# Patient Record
Sex: Female | Born: 1985 | Race: Black or African American | Hispanic: No | Smoking: Never smoker
Health system: Southern US, Community
[De-identification: ages and names within clinical notes are randomized; demographics above are authoritative.]

## PROBLEM LIST (undated history)

## (undated) ENCOUNTER — Inpatient Hospital Stay (HOSPITAL_COMMUNITY): Payer: Self-pay

## (undated) DIAGNOSIS — N920 Excessive and frequent menstruation with regular cycle: Secondary | ICD-10-CM

## (undated) DIAGNOSIS — G43909 Migraine, unspecified, not intractable, without status migrainosus: Secondary | ICD-10-CM

## (undated) DIAGNOSIS — D649 Anemia, unspecified: Secondary | ICD-10-CM

## (undated) HISTORY — PX: NO PAST SURGERIES: SHX2092

## (undated) HISTORY — DX: Migraine, unspecified, not intractable, without status migrainosus: G43.909

---
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
## 2007-01-30 ENCOUNTER — Ambulatory Visit (HOSPITAL_COMMUNITY): Admission: RE | Admit: 2007-01-30 | Discharge: 2007-01-30 | Payer: Self-pay | Admitting: Family Medicine

## 2007-02-26 ENCOUNTER — Ambulatory Visit (HOSPITAL_COMMUNITY): Admission: RE | Admit: 2007-02-26 | Discharge: 2007-02-26 | Payer: Self-pay | Admitting: Obstetrics and Gynecology

## 2007-03-18 ENCOUNTER — Inpatient Hospital Stay (HOSPITAL_COMMUNITY): Admission: AD | Admit: 2007-03-18 | Discharge: 2007-03-18 | Payer: Self-pay | Admitting: Gynecology

## 2007-03-18 ENCOUNTER — Ambulatory Visit: Payer: Self-pay | Admitting: Physician Assistant

## 2007-04-30 ENCOUNTER — Encounter: Payer: Self-pay | Admitting: Obstetrics & Gynecology

## 2007-04-30 ENCOUNTER — Inpatient Hospital Stay (HOSPITAL_COMMUNITY): Admission: AD | Admit: 2007-04-30 | Discharge: 2007-05-03 | Payer: Self-pay | Admitting: Obstetrics & Gynecology

## 2007-04-30 ENCOUNTER — Ambulatory Visit: Payer: Self-pay | Admitting: Obstetrics & Gynecology

## 2007-10-15 ENCOUNTER — Emergency Department (HOSPITAL_COMMUNITY): Admission: EM | Admit: 2007-10-15 | Discharge: 2007-10-15 | Payer: Self-pay | Admitting: Emergency Medicine

## 2008-04-09 ENCOUNTER — Emergency Department (HOSPITAL_COMMUNITY): Admission: EM | Admit: 2008-04-09 | Discharge: 2008-04-09 | Payer: Self-pay | Admitting: Family Medicine

## 2010-02-04 ENCOUNTER — Ambulatory Visit: Payer: Self-pay | Admitting: Nurse Practitioner

## 2010-02-04 DIAGNOSIS — E669 Obesity, unspecified: Secondary | ICD-10-CM

## 2010-02-04 DIAGNOSIS — R635 Abnormal weight gain: Secondary | ICD-10-CM | POA: Insufficient documentation

## 2010-02-04 DIAGNOSIS — R5383 Other fatigue: Secondary | ICD-10-CM

## 2010-02-04 DIAGNOSIS — R5381 Other malaise: Secondary | ICD-10-CM

## 2010-02-08 ENCOUNTER — Encounter (INDEPENDENT_AMBULATORY_CARE_PROVIDER_SITE_OTHER): Payer: Self-pay | Admitting: Nurse Practitioner

## 2010-03-04 ENCOUNTER — Encounter (INDEPENDENT_AMBULATORY_CARE_PROVIDER_SITE_OTHER): Payer: Self-pay | Admitting: Nurse Practitioner

## 2010-03-09 ENCOUNTER — Other Ambulatory Visit: Admission: RE | Admit: 2010-03-09 | Discharge: 2010-03-09 | Payer: Self-pay | Admitting: Internal Medicine

## 2010-03-09 ENCOUNTER — Ambulatory Visit: Payer: Self-pay | Admitting: Nurse Practitioner

## 2010-03-10 ENCOUNTER — Encounter (INDEPENDENT_AMBULATORY_CARE_PROVIDER_SITE_OTHER): Payer: Self-pay | Admitting: Nurse Practitioner

## 2010-03-10 LAB — CONVERTED CEMR LAB
HDL: 55 mg/dL (ref >39)
LDL Cholesterol: 130 mg/dL — ABNORMAL HIGH (ref 0–99)
Total CHOL/HDL Ratio: 3.6
Triglycerides: 68 mg/dL (ref <150)
VLDL: 14 mg/dL (ref 0–40)

## 2010-03-14 ENCOUNTER — Encounter (INDEPENDENT_AMBULATORY_CARE_PROVIDER_SITE_OTHER): Payer: Self-pay | Admitting: Nurse Practitioner

## 2010-03-23 ENCOUNTER — Ambulatory Visit: Payer: Self-pay | Admitting: Nurse Practitioner

## 2010-03-24 ENCOUNTER — Ambulatory Visit: Payer: Self-pay | Admitting: Nurse Practitioner

## 2010-03-24 LAB — CONVERTED CEMR LAB: Preg, Serum: NEGATIVE

## 2010-04-20 ENCOUNTER — Ambulatory Visit: Payer: Self-pay | Admitting: Nurse Practitioner

## 2010-04-20 LAB — CONVERTED CEMR LAB
Blood in Urine, dipstick: NEGATIVE
Cholesterol, target level: 200 mg/dL
Glucose, Urine, Semiquant: NEGATIVE
HDL goal, serum: 40 mg/dL
LDL Goal: 160 mg/dL
Protein, U semiquant: NEGATIVE

## 2010-04-23 ENCOUNTER — Emergency Department (HOSPITAL_COMMUNITY): Admission: EM | Admit: 2010-04-23 | Discharge: 2010-04-23 | Payer: Self-pay | Admitting: Family Medicine

## 2010-04-25 ENCOUNTER — Ambulatory Visit: Payer: Self-pay | Admitting: Nurse Practitioner

## 2010-10-02 LAB — CONVERTED CEMR LAB
Basophils Absolute: 0 10*3/uL (ref 0.0–0.1)
Chlamydia, DNA Probe: NEGATIVE
GC Probe Amp, Genital: NEGATIVE
Glucose, Urine, Semiquant: NEGATIVE
HCT: 36 % (ref 36.0–46.0)
Hemoglobin: 10.6 g/dL — ABNORMAL LOW (ref 12.0–15.0)
Ketones, urine, test strip: NEGATIVE
Lymphocytes Relative: 25 % (ref 12–46)
Monocytes Absolute: 0.6 10*3/uL (ref 0.1–1.0)
Monocytes Relative: 6 % (ref 3–12)
Neutro Abs: 7.3 10*3/uL (ref 1.7–7.7)
Nitrite: POSITIVE
RBC: 4.47 M/uL (ref 3.87–5.11)
Retic Ct Pct: 0.7 % (ref 0.4–3.1)
Specific Gravity, Urine: >=1.03
pH: 5

## 2010-10-04 NOTE — Progress Notes (Signed)
Summary: Office Visit//DEPRESSION SCREENING  Office Visit//DEPRESSION SCREENING   Imported By: Arta Bruce 03/10/2010 09:36:53  _____________________________________________________________________  External Attachment:    Type:   Image     Comment:   External Document

## 2010-10-04 NOTE — Letter (Signed)
Summary: *HSN Results Follow up  [facility]  [address] [place], [place] [address]   Phone: [phone]  Fax: [phone]      03/14/2010   Jean Glass [address] [place], [place]  [address]   Dear  Ms. [name],                            ____S.Drinkard,FNP   ____D. Gore,FNP       ____B. McPherson,MD   ____V. Rankins,MD    ____E. Mulberry,MD    _X___[name], FNP  ____D. Reche Dixon, MD    ____K. Philipp Deputy, MD    ____Other     This letter is to inform you that your recent test(s):  ___X____Pap Smear    _______Lab Test     _______X-ray    ___X____ is within acceptable limits  _______ requires a medication change  _______ requires a follow-up lab visit  _______ requires a follow-up visit with your provider   Comments: Pap Smear results are normal.       _________________________________________________________ If you have any questions, please contact our office [phone].                    Sincerely,    Lehman Prom FNP [facility]

## 2010-10-04 NOTE — Letter (Signed)
Summary: PT INFORMATION SHEET  PT INFORMATION SHEET   Imported By: Arta Bruce 03/14/2010 15:37:35  _____________________________________________________________________  External Attachment:    Type:   Image     Comment:   External Document

## 2010-10-04 NOTE — Letter (Signed)
Summary: RECEIVED RECORDS FROM [facility]  RECEIVED RECORDS FROM [facility]   Imported By: Arta Bruce 05/25/2010 14:53:06  _____________________________________________________________________  External Attachment:    Type:   Image     Comment:   External Document

## 2010-10-04 NOTE — Letter (Signed)
Summary: REQUESTING RECORDS FROM [facility]  REQUESTING RECORDS FROM [facility]   Imported By: Arta Bruce 02/25/2010 12:48:31  _____________________________________________________________________  External Attachment:    Type:   Image     Comment:   External Document

## 2010-10-04 NOTE — Assessment & Plan Note (Signed)
Summary: Acute - Candidiasis   Vital Signs:  Patient profile:   25 year old female Menstrual status:  regular Height:      63.5 inches Weight:      184 pounds Temp:     97.9 degrees F oral Pulse rate:   87 / minute Pulse rhythm:   regular Resp:     18 per minute BP sitting:   101 / 66  (left arm)  Vitals Entered By: [place] Shannon (April 20, 2010 9:38 AM) CC: uti, Vaginal discharge, Lipid Management Is Patient Diabetic? No Pain Assessment Patient in pain? no       Does patient need assistance? Functional Status Self care Ambulation Normal   CC:  uti, Vaginal discharge, and Lipid Management.  History of Present Illness:  Pt into the office with complaints of vaginal irritation   Vaginal discharge      This is a 25 year old woman who presents with Vaginal discharge.  The patient complains of itching and vaginal burning, but denies burning on urination, frequency, urgency, and fever.  The discharge is described as malodorous.  Risk factors for vaginal discharge include recent antibiotics - macrobid for cystitis. Pt reports that she started 1 week after the Rx was sent to the pharmacy but she completed the entire course.   Lipid Management History:      Negative NCEP/ATP III risk factors include female age less than 34 years old and non-tobacco-user status.    Current Medications (verified): 1)  Depo-Provera 150 Mg/ml Susp (Medroxyprogesterone Acetate) .... One Injection Every 3 Months 2)  Macrobid 100 Mg Caps (Nitrofurantoin Monohyd Macro) .... One Capsule By Mouth Two Times A Day For Infection  Allergies (verified): No Known Drug Allergies  Review of Systems General:  Denies fever. GI:  Denies abdominal pain, nausea, and vomiting. GU:  Denies discharge; +vaginal irritation.  Physical Exam  General:  alert.   Head:  normocephalic.   Lungs:  normal breath sounds.   Heart:  normal rate and regular rhythm.   Abdomen:  normal bowel sounds.   Genitalia:  self wet  prep   Impression & Recommendations:  Problem # 1:  CANDIDIASIS (ICD-112.9) most likely due to recent antibiotics Orders: KOH/ WET Mount [phone])  Problem # 2:  ACUTE CYSTITIS (ICD-595.0) will resend urine for culture to test for cure The following medications were removed from the medication list:    Macrobid 100 Mg Caps (Nitrofurantoin monohyd macro) ..... One capsule by mouth two times a day for infection  Orders: UA Dipstick w/o Micro (manual) (82956) T-Culture, Urine ([id number])  Complete Medication List: 1)  Depo-provera 150 Mg/ml Susp (Medroxyprogesterone acetate) .... One injection every 3 months 2)  Terazol 3 0.8 % Crea (Terconazole) .... One application intravaginally for 3 nights  Lipid Assessment/Plan:      Based on NCEP/ATP III, the patient's risk factor category is "0-1 risk factors".  The patient's lipid goals are as follows: Total cholesterol goal is 200; LDL cholesterol goal is 160; HDL cholesterol goal is 40; Triglyceride goal is 150.     Patient Instructions: 1)  Urine will be sent for culture to be sure that infection has cleared. 2)  You do have a yeast infection which is likely due to recently taking antibiotics. Use vaginal cream as ordered for 3 nights 3)  Follow up as needed Prescriptions: TERAZOL 3 0.8 % CREA (TERCONAZOLE) One application intravaginally for 3 nights  #20gm x 0   Entered and Authorized by:  Lehman Prom FNP   Signed by:   Lehman Prom FNP on 04/20/2010   Method used:   Print then Give to Patient   RxID:   [id number]   Laboratory Results   Urine Tests  Date/Time Received: April 20, 2010 .  Routine Urinalysis   Glucose: negative   (Normal Range: Negative) Bilirubin: negative   (Normal Range: Negative) Ketone: negative   (Normal Range: Negative) Spec. Gravity: >=1.030   (Normal Range: 1.003-1.035) Blood: negative   (Normal Range: Negative) pH: 7.5   (Normal Range: 5.0-8.0) Protein: negative   (Normal Range:  Negative) Urobilinogen: 0.2   (Normal Range: 0-1) Nitrite: negative   (Normal Range: Negative) Leukocyte Esterace: moderate   (Normal Range: Negative)    Date/Time Received: April 20, 2010 10:17 AM   [employer] Mount Source: vaginal WBC/hpf: 1-5 Bacteria/hpf: rare Clue cells/hpf: few Yeast/hpf: moderate Wet Mount KOH: Negative Trichomonas/hpf: none

## 2010-10-04 NOTE — Assessment & Plan Note (Signed)
Summary: Abnormal menstrual cycle   Vital Signs:  Patient profile:   25 year old female Menstrual status:  regular LMP:     03/2010 Weight:      185.8 pounds Temp:     98.1 degrees F oral Pulse rate:   75 / minute Pulse rhythm:   regular Resp:     20 per minute BP sitting:   103 / 70  (left arm) Cuff size:   regular  Vitals Entered By: Levon Hedger (April 25, 2010 3:12 PM) CC: pt has been experiencing a heavy blood flow since Saturday... Is Patient Diabetic? No Pain Assessment Patient in pain? no       Does patient need assistance? Functional Status Self care Ambulation Normal LMP (date): 03/2010 LMP - Character: normal Menarche (age onset years): 16    Menstrual flow (days): 6 Enter LMP: 03/2010 Last PAP Result  Specimen Adequacy: Satisfactory for evaluation.   Interpretation/Result:Negative for intraepithelial Lesion or Malignancy.      CC:  pt has been experiencing a heavy blood flow since Saturday....  History of Present Illness:  Pt into the office with complaints of bleeding. She was started on Depoprovera on 03/09/2010.  She had previously been on depoprovera several years ago at age 34 during which time she did not have a menses while on the medication.  Of concern now is that pt has restarted her depoprovera and now she has a menses.  She is unsure if this is normal. She was treated on last week for candidiasis and took the medication for 3 nights as ordered.  On the following day the bleeding started. -abdominal cramps -no increase in flow other than what she usually has during menses -nausea or vomiting  -diarrhea -fever  Allergies (verified): No Known Drug Allergies  Review of Systems General:  Denies fever. CV:  Denies chest pain or discomfort. Resp:  Denies cough. GI:  Denies nausea and vomiting. GU:  Complains of abnormal vaginal bleeding; ? menses that started .  Physical Exam  General:  alert.   Head:  normocephalic.   Msk:  normal  ROM.   Neurologic:  alert & oriented X3.   Skin:  color normal.   Psych:  Oriented X3.     Impression & Recommendations:  Problem # 1:  CANDIDIASIS (ICD-112.9) pt has finished treatment as ordered  Problem # 2:  FAMILY PLANNING (ICD-V25.09) pt is on depo provera. most likely bleeding is a side effect from restarting medications advised pt that irregular bleeding will most likely improve after being on medication for 6-9 months. she is to keep appt for next injection  Complete Medication List: 1)  Depo-provera 150 Mg/ml Susp (Medroxyprogesterone acetate) .... One injection every 3 months  Patient Instructions: 1)  Keep your appointment for depoprovera at next scheduled visit. 2)  Be mindful that current discharge is likely from re-starting the depoprovera.  This should get better after you take the next injection.

## 2010-10-04 NOTE — Assessment & Plan Note (Signed)
Summary: DEPO INJECTION / NS  Nurse Visit   Allergies: No Known Drug Allergies  Medication Administration  Injection # 1:    Medication: Depo-Provera 150mg     Diagnosis: FAMILY PLANNING (ICD-V25.09)    Route: IM    Site: RUOQ gluteus    Exp Date: 07/2012    Lot #: [id number]    Mfr: greenstone    Comments: next injection 06/09/2010    Patient tolerated injection without complications    Given by: Levon Hedger (March 24, 2010 1:06 PM)  Orders Added: 1)  Admin of Therapeutic Inj  intramuscular or subcutaneous [96372] 2)  Depo-Provera 150mg  [J1055] 3)  Est. Patient Level I [67209]     Medication Administration  Injection # 1:    Medication: Depo-Provera 150mg     Diagnosis: FAMILY PLANNING (ICD-V25.09)    Route: IM    Site: RUOQ gluteus    Exp Date: 07/2012    Lot #: [id number]    Mfr: greenstone    Comments: next injection 06/09/2010    Patient tolerated injection without complications    Given by: Levon Hedger (March 24, 2010 1:06 PM)  Orders Added: 1)  Admin of Therapeutic Inj  intramuscular or subcutaneous [96372] 2)  Depo-Provera 150mg  [J1055] 3)  Est. Patient Level I [28366]

## 2010-10-04 NOTE — Assessment & Plan Note (Signed)
Summary: Complete Physical Exam   Vital Signs:  Patient profile:   25 year old female Menstrual status:  regular LMP:     03/01/2010 Weight:      181.5 pounds BMI:     31.76 BSA:     1.87 Temp:     98.3 degrees F oral Pulse rate:   79 / minute Pulse rhythm:   regular Resp:     20 per minute BP sitting:   119 / 80  (left arm) Cuff size:   regular  Vitals Entered By: Levon Hedger (March 09, 2010 9:14 AM)  Nutrition Counseling: Patient's BMI is greater than 25 and therefore counseled on weight management options. CC: CPP Is Patient Diabetic? No Pain Assessment Patient in pain? no       Does patient need assistance? Functional Status Self care Ambulation Normal LMP (date): 03/01/2010 LMP - Character: normal Menarche (age onset years): 16    Menstrual flow (days): 6 Menstrual Status regular Enter LMP: 03/01/2010   CC:  CPP.  History of Present Illness:  Pt into the office for a complete physical exam  PAP - last PAP 1 year. Abnormal PAP in 2007 for which she had repeat in 6 months and it was normal. No intervention. 1 child age 15 months Menses - monthly.  ? birth control - previously taking depoprovera before the birth of her child in both 2007 and 2009. Pt is sexually active at this time.  Mammogram - No history of mammogram  no family history of breast cancer self breast exams at home  Optho - no glasses or contacts.  never had an eye exam. no current problems with eyes  Dental - no recent dental exam.  tdap - last over 10 years ago.  pt is fasting today for labs  Habits & Providers  Alcohol-Tobacco-Diet     Alcohol drinks/day: 0     Tobacco Status: never  Exercise-Depression-Behavior     Does Patient Exercise: no     Exercise Counseling: to improve exercise regimen     Drug Use: never  Comments: PHQ-9 score = 12  Medications Prior to Update: 1)  None  Allergies (verified): No Known Drug Allergies  Social History: Does Patient  Exercise:  no  Review of Systems General:  Complains of fatigue; denies fever. Eyes:  Denies blurring. ENT:  Denies earache. CV:  Denies chest pain or discomfort. Resp:  Denies cough. GI:  Denies abdominal pain, nausea, and vomiting. GU:  Denies discharge. MS:  Denies joint pain. Derm:  Denies rash. Neuro:  Denies headaches. Psych:  Denies anxiety and depression. Endo:  Denies excessive urination.  Physical Exam  General:  alert.   Head:  normocephalic.   Eyes:  vision grossly intact, pupils equal, and pupils round.   Ears:  bil TM with bony landmarks present minimal cerumen bilaterally Nose:  no nasal discharge.   Mouth:  pharynx pink and moist and fair dentition.   right upper molar broken  Neck:  supple.   Chest Wall:  no mass.   Breasts:  no masses.   Lungs:  normal breath sounds.   Heart:  normal rate and regular rhythm.   Abdomen:  soft, non-tender, and normal bowel sounds.   Msk:  up to the exam table Pulses:  R radial normal and R dorsalis pedis normal.   Extremities:  no edema Neurologic:  alert & oriented X3, cranial nerves II-XII intact, and gait normal.   Skin:  multiple tattoos on  back, arms and legs Psych:  Oriented X3.    Pelvic Exam  Vulva:      normal appearance.   Urethra and Bladder:      Urethra--no masses.   Vagina:      physiologic discharge.   Cervix:      midposition.   Uterus:      smooth.   Adnexa:      nontender bilaterally.      Impression & Recommendations:  Problem # 1:  ROUTINE GYNECOLOGICAL EXAMINATION (ICD-V72.31)  PHQ-9 score = 12 PAP done labs done  rec. dental exam no tdap available - advised pt that she needs to get at next visit Orders: KOH/ WET Mount [id number]) Pap Smear, Thin Prep ( Collection of) [phone]) T- GC Chlamydia (09811) T-HIV Antibody  (Reflex) ([id number]) T-Syphilis Test ([facility]) ([id number])  Problem # 2:  FAMILY PLANNING (ICD-V25.09) pt would like to restart on the depoprovera Rx written today -  pt to return today for injection (addendum: pt did not return today for injection) LMP ended yesterday  Complete Medication List: 1)  Depo-provera 150 Mg/ml Susp (Medroxyprogesterone acetate) .... One injection every 3 months  Other Orders: UA Dipstick w/o Micro (manual) (96295) T-Lipid Profile ([id number]) T-Comprehensive Metabolic Panel ([id number]) T- * Misc. Laboratory test [phone]) T-TSH ([phone]) T-Culture, Urine [phone])  Patient Instructions: 1)  Labs will be done today and you will be notified of the results. 2)  You can restart depoprovera - go get the medication and bring back to this office today for injection.  [facility] front desk staff to inform nurse that you have returned. 3)  Schedule an appointment for a dental exam. You have medicaid so find a dentist that will accept medicaid 4)  Will need tdap at next visit 5)  Follow up as needed or yearly for physical exams Prescriptions: DEPO-PROVERA 150 MG/ML SUSP (MEDROXYPROGESTERONE ACETATE) One injection every 3 months  #1 x 4   Entered and Authorized by:   Lehman Prom FNP   Signed by:   Lehman Prom FNP on 03/09/2010   Method used:   Print then Give to Patient   RxID:   [id number]   Laboratory Results   Urine Tests  Date/Time Received: March 09, 2010 9:42 AM   Routine Urinalysis   Color: yellow Glucose: negative   (Normal Range: Negative) Bilirubin: small   (Normal Range: Negative) Ketone: negative   (Normal Range: Negative) Spec. Gravity: >=1.030   (Normal Range: 1.003-1.035) Blood: trace-intact   (Normal Range: Negative) pH: 5.0   (Normal Range: 5.0-8.0) Protein: trace   (Normal Range: Negative) Urobilinogen: 0.2   (Normal Range: 0-1) Nitrite: positive   (Normal Range: Negative) Leukocyte Esterace: trace   (Normal Range: Negative)    Date/Time Received: March 09, 2010 10:10 AM   Wet Mount Source: vaginal WBC/hpf: 1-5 Bacteria/hpf: 1+ Clue cells/hpf: none Yeast/hpf: none Wet  Mount KOH: Negative Trichomonas/hpf: none    Prevention & Chronic Care Immunizations   Influenza vaccine: Not documented    Tetanus booster: Not documented   Td booster deferral: Not available  (03/09/2010)    Pneumococcal vaccine: Not documented  Other Screening   Pap smear: Not documented   Pap smear action/deferral: Ordered  (03/09/2010)   Pap smear due: 03/10/2011   Smoking status: never  (03/09/2010)

## 2010-10-04 NOTE — Letter (Signed)
Summary: Handout Printed  Printed Handout:  - Birth Control Choices 

## 2010-10-04 NOTE — Assessment & Plan Note (Signed)
Summary: NEW - Establish Care   Vital Signs:  Patient profile:   25 year old female LMP:     01/2010 Height:      63.5 inches Weight:      182.8 pounds BMI:     31.99 BSA:     1.87 Temp:     98.1 degrees F oral Pulse rate:   94 / minute Pulse rhythm:   regular Resp:     20 per minute BP sitting:   106 / 74  (left arm) Cuff size:   regular  Vitals Entered By: Levon Hedger (February 04, 2010 10:42 AM) CC: new establish...needs physical Is Patient Diabetic? No Pain Assessment Patient in pain? no       Does patient need assistance? Functional Status Self care Ambulation Normal LMP (date): 01/2010     Enter LMP: 01/2010   CC:  new establish...needs physical.  History of Present Illness:  Pt into the office to establish care. Pt was previously seen at [facility] for yearly PAP smears - last done ove 1 year ago. 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  She was then transitioned to depoprovera.  Last injection was in August 2010 and at that time her medicaid ended so she was not able to continue to get the injections. Main side effects was weight gain from the depoprovera She has never tried oral contraception or patches  No acute problems today  Habits & Providers  Alcohol-Tobacco-Diet     Alcohol drinks/day: 0     Tobacco Status: never  Exercise-Depression-Behavior     Drug Use: never  Medications Prior to Update: 1)  None  Allergies (verified): No Known Drug Allergies  Past History:  Past Medical History: none  Past Surgical History: Caesarean section  Family History: maternal grandmother - htn  Social History: 1 child tobacco - none ETOH - none Drug - noneSmoking Status:  never Drug Use:  never  Review of  Systems General:  Complains of fatigue; denies loss of appetite; recent weight gain. CV:  Denies chest pain or discomfort. Resp:  Denies cough and shortness of breath. GI:  Denies constipation, nausea, and vomiting. MS:  Denies joint pain.  Physical Exam  General:  alert.   Head:  normocephalic.   Lungs:  normal breath sounds.   Heart:  normal rate and regular rhythm.   Msk:  up to the exam table Neurologic:  alert & oriented X3, cranial nerves II-XII intact, and gait normal.   Skin:  color normal.   Psych:  Oriented X3.     Impression & Recommendations:  Problem # 1:  WEIGHT GAIN (ICD-783.1) ? if due to depoprovera will advise pt to increase her physical activity  Problem # 2:  FATIGUE (ICD-780.79) will check labs on next visit  Problem # 3:  FAMILY PLANNING (ICD-V25.09) handout given on available options pt to review and will discuss at next visit.  Patient Instructions: 1)  You will need to schedule an appointment for a complete physical exam.  Do not eat after midnight before this visit. 2)  Sign a release to get records from [facility]. 3)  Read handout on birth control  Review to determine  which option may be the best for you.

## 2010-11-17 LAB — POCT URINALYSIS DIPSTICK
Bilirubin Urine: NEGATIVE
Glucose, UA: NEGATIVE mg/dL
Nitrite: NEGATIVE
Protein, ur: NEGATIVE mg/dL
Specific Gravity, Urine: 1.02 (ref 1.005–1.030)
Urobilinogen, UA: 1 mg/dL (ref 0.0–1.0)
pH: 7 (ref 5.0–8.0)

## 2010-11-17 LAB — POCT PREGNANCY, URINE: Preg Test, Ur: NEGATIVE

## 2010-11-17 LAB — WET PREP, GENITAL: Yeast Wet Prep HPF POC: NONE SEEN

## 2010-11-17 LAB — GC/CHLAMYDIA PROBE AMP, GENITAL
Chlamydia, DNA Probe: NEGATIVE
GC Probe Amp, Genital: NEGATIVE

## 2011-01-17 NOTE — Op Note (Signed)
[name], [name]           ACCOUNT NO.:  [id number]   MEDICAL RECORD NO.:  [id number]          PATIENT TYPE:  INP   LOCATION:  9119                          FACILITY:  [facility]   PHYSICIAN:  Johnella Moloney, MD        DATE OF BIRTH:  [birth year]   DATE OF PROCEDURE:  04/30/2007  DATE OF DISCHARGE:                               OPERATIVE REPORT   PREOPERATIVE DIAGNOSIS:  Non-reassuring fetal heart tracing.   POSTOPERATIVE DIAGNOSIS:  Non-reassuring fetal heart tracing.   PROCEDURE:  Primary low transverse cesarean section via Pfannenstiel  incision.   SURGEON:  Dr. Elsie Lincoln, Dr. Johnella Moloney.   ASSISTANT:  Dr. Darrol Angel.   ANESTHESIA:  Epidural.   IV FLUIDS:  1800 mL   ESTIMATED BLOOD LOSS:  700 mL   SPECIMENS:  Placenta to pathology.   COMPLICATIONS:  None.   FINDINGS:  Viable female infant in cephalic presentation.  Apgars were 8  and 9.  Weight 6 pounds, 7 ounces.  Cord pH was 7.32. Spontaneous  placental delivery, intact with three-vessel cord.  Normal uterus,  ovaries and tubes bilaterally.   PROCEDURE IN DETAIL:  The patient was taken to the operating room for  urgent cesarean section given second episode of prolonged bradycardia.  She was urgently prepped and draped in a sterile manner.  The patient  was then placed in a dorsal supine position.  A Pfannenstiel skin  incision was then made and taken to the layer of underlying fascia.  The  fascia was incised in the midline, and this incision was extended  bilaterally using a Mayo scissors.  Kochers were used to elevate the  superior aspect of this incision and the rectus muscles were dissected  off bluntly and sharply.  A similar process was carried out inferiorly.  The rectus muscles were separated in the midline and the peritoneum was  entered bluntly.  The peritoneal incision was extended bluntly with good  visualization of the bowel and bladder.  The lower uterine segment was  then recognized.  A bladder flap was  created and a low transverse  hysterotomy was also created using a scalpel and blunt methods.  The  infant was then delivered atraumatically.  The cord was clamped and cut  and the infant was handed over to the waiting pediatricians.  Cord blood  was collected.  The placenta was then delivered spontaneously and the  uterus was cleared of all clot and debris.  The uterus was exteriorized  and the hysterotomy was closed with 0 Vicryl in a running locked  fashion.  A second layer was used for hemostasis.  Good hemostasis was  noted overall and excellent tone was noted.  The uterus was then  returned to the abdomen.  The pelvis  and abdomen were then copiously  irrigated with normal saline, and good hemostasis was  noted overall.  At this point the fascia was closed with 0 Vicryl in a  running fashion, and the skin was closed with staples.  The patient  tolerated the procedure well.  Sponge, lap, needle and instruments  counts were correct times two.  The patient was taken to the recovery  room in stable condition.      Johnella Moloney, MD  Electronically Signed     UD/MEDQ  D:  04/30/2007  T:  05/01/2007  Job:  [id number]

## 2011-01-17 NOTE — Discharge Summary (Signed)
Jean Glass, Jean Glass           ACCOUNT NO.:  [id number]   MEDICAL RECORD NO.:  [id number]          PATIENT TYPE:  INP   LOCATION:  9119                          FACILITY:  [facility]   PHYSICIAN:  Allie Bossier, MD        DATE OF BIRTH:  May 10, 1986   DATE OF ADMISSION:  04/30/2007  DATE OF DISCHARGE:  05/03/2007                               DISCHARGE SUMMARY   ADMISSION DIAGNOSIS:  Term pregnancy at 40 weeks' gestation. Admitted  for induction of labor secondary to variable decelerations noted on  fetal heart tracing.   DISCHARGE DIAGNOSIS:  Postoperative day 3, status post low transverse C  section for fetal bradycardia, nonreassuring fetal heart tracing.   HOSPITAL COURSE:  The patient was admitted on 04/30/07, with complaint of  contractions. Placed on the monitor and found not to be in active labor.  However, on fetal heart tracing the patient's  fetus was noted to have  several variable decelerations. Given that the patient was at term it  was decided that she should be admitted for induction of labor. The  patient tolerated induction of labor well. However, at bout 2:00 p.m. on  04/30/07, on fetal heart tracing the fetus was noted to have  nonreassuring fetal heart tracing and bradycardia.   Therefore, was taken for primary low transverse C section. This  operation was performed by Drs. Penne Lash and Duru with assistant Dr. Kipp Laurence. Estimated blood loss in this procedure was 700 mL and was  without complications. The operation rendered a viable female infant  without Apgars of 8 and 9 and a weight of 6 pounds and 7 ounces.  Placenta was delivered without difficulty and the uterus was cleared of  clot and debris. Cord pH was performed and found to be 7.32.   Postoperatively the patient recovered well without any complications.  Postoperative CBC was noted to have a hemoglobin of 8.4, hematocrit of  26.2, platelet count of 204 and white blood cell count of 14.4.  Throughout the  rest of her hospital course the patient remained  asymptomatic for anemia and remained afebrile with no worrisome changes  and vital signs throughout the rest of her hospital stay.   On postoperative day 3 the patient was ready for discharge without any  complications. Staples were removed prior to discharge and the patient  was discharged to home in good and stable condition with infant.   DISCHARGE MEDICATIONS:  The patient is discharged home on  1. Motrin 600 mg orally q. 6 h p.r.n. pain.  2. Percocet 5/325 one to two tabs orally 4-6 h p.r.n., pain.  3. Colace 100 mg orally b.i.d. p.r.n., constipation.  4. Ferrous sulfate 325 mg orally b.i.d. for anemia.  5. Prenatal vitamins 1 tab orally once daily.   DISCHARGE INSTRUCTIONS:  1. The patient is to take all medication mentioned previously.  2. The patient is to follow up in 6 weeks at [facility]      [facility].  3. The patient is to maintain pelvic rest for 6 weeks with nothing in      the  vagina over this time.  4. The patient is to not undertake any heavy lifting for 6 weeks.  5. The patient is to return prior to her routine scheduled followup      for any worsening symptoms including      increased pain, increased bleeding, dizziness, presyncopal symptoms      or any fevers, chills or any other worrisome symptoms.   DISCHARGE CONDITION:  Stable, good.      [name], MD      Allie Bossier, MD  Electronically Signed    TE/MEDQ  D:  05/03/2007  T:  05/04/2007  Job:  [id number]

## 2011-01-20 NOTE — Op Note (Signed)
NAME:  Jean Glass, Jean Glass                     ACCOUNT NO.:  [id number]   MEDICAL RECORD NO.:  [id number]                   PATIENT TYPE:  OIB   LOCATION:  2899                                 FACILITY:  [facility]   PHYSICIAN:  Jon Gills, M.D.               DATE OF BIRTH:  29-Apr-1986   DATE OF PROCEDURE:  04/08/2004  DATE OF DISCHARGE:                                 OPERATIVE REPORT   PREOPERATIVE DIAGNOSIS:  Lower gastrointestinal bleeding, undetermined  cause.   POSTOPERATIVE DIAGNOSIS:  Lower gastrointestinal bleeding, undetermined  cause.   PROCEDURE:  Colonoscopy with biopsy.   SURGEON:  Jon Gills, M.D.   DESCRIPTION OF PROCEDURE:  Following written informed consent, the patient  was sedated with Versed and propofol by an anesthesiologist.  Examination of  the perineum revealed no tags or fissures.  Digital examination of the  rectum revealed an empty rectal vault.  The colonoscope was inserted per  rectum and advanced to 110 cm to the ascending colon.  Normal mucosa was  seen throughout although formed stool partially obscured the lumen in the  transverse colon.  No evidence for polyps or vascular abnormalities was  seen.  There was no visible evidence for blood.  Multiple surveillance  biopsies were obtained at three points within the colon.  The colonoscope  was withdrawn, and the patient was awakened and taken to the recovery room  in satisfactory condition.  It was my impression that her lower GI bleeding  was secondary to constipation with occult perianal tearing.  I placed her on  a trial of MiraLax 17 g p.o. daily and scheduled a two month return  appointment.   DESCRIPTION OF SCOPE:  Olympus 160 colonoscope.   DESCRIPTION OF SPECIMENS REMOVED:  Colon biopsies x6 submitted in three  individual jars.                                               Jon Gills, M.D.    JHC/MEDQ  D:  04/08/2004  T:  04/09/2004  Job:  [id number]

## 2011-05-26 LAB — URINALYSIS, ROUTINE W REFLEX MICROSCOPIC
Glucose, UA: NEGATIVE
Specific Gravity, Urine: 1.02
pH: 6.5

## 2011-05-26 LAB — URINE CULTURE: Colony Count: >=100000

## 2011-05-26 LAB — URINE MICROSCOPIC-ADD ON

## 2011-05-26 LAB — POCT PREGNANCY, URINE: Preg Test, Ur: NEGATIVE

## 2011-06-02 LAB — POCT URINALYSIS DIP (DEVICE)
Bilirubin Urine: NEGATIVE
Glucose, UA: NEGATIVE
Nitrite: POSITIVE — AB
Specific Gravity, Urine: 1.02

## 2011-06-02 LAB — GC/CHLAMYDIA PROBE AMP, GENITAL: Chlamydia, DNA Probe: POSITIVE — AB

## 2011-06-16 LAB — CBC
HCT: 34 — ABNORMAL LOW
Hemoglobin: 11 — ABNORMAL LOW
Hemoglobin: 8.4 — ABNORMAL LOW
MCHC: 32
Platelets: 285
RBC: 3.62 — ABNORMAL LOW
WBC: 12.3 — ABNORMAL HIGH

## 2011-06-20 LAB — URINALYSIS, ROUTINE W REFLEX MICROSCOPIC
Glucose, UA: NEGATIVE
Hgb urine dipstick: NEGATIVE
Protein, ur: 30 — AB
pH: 6.5

## 2011-06-20 LAB — WET PREP, GENITAL
Clue Cells Wet Prep HPF POC: NONE SEEN
Trich, Wet Prep: NONE SEEN

## 2011-06-20 LAB — URINE MICROSCOPIC-ADD ON

## 2011-06-20 LAB — GC/CHLAMYDIA PROBE AMP, GENITAL
Chlamydia, DNA Probe: NEGATIVE
GC Probe Amp, Genital: NEGATIVE

## 2011-06-20 LAB — CULTURE, ROUTINE-ABSCESS

## 2012-06-24 ENCOUNTER — Emergency Department (HOSPITAL_COMMUNITY): Payer: Self-pay

## 2012-06-24 ENCOUNTER — Encounter (HOSPITAL_COMMUNITY): Payer: Self-pay | Admitting: Adult Health

## 2012-06-24 ENCOUNTER — Emergency Department (HOSPITAL_COMMUNITY)
Admission: EM | Admit: 2012-06-24 | Discharge: 2012-06-24 | Disposition: A | Discharge status: Home / Self Care | Payer: Self-pay | Attending: Emergency Medicine | Admitting: Emergency Medicine

## 2012-06-24 DIAGNOSIS — Z862 Personal history of diseases of the blood and blood-forming organs and certain disorders involving the immune mechanism: Secondary | ICD-10-CM | POA: Insufficient documentation

## 2012-06-24 DIAGNOSIS — Z3201 Encounter for pregnancy test, result positive: Secondary | ICD-10-CM | POA: Insufficient documentation

## 2012-06-24 DIAGNOSIS — R10814 Left lower quadrant abdominal tenderness: Secondary | ICD-10-CM | POA: Insufficient documentation

## 2012-06-24 DIAGNOSIS — Z3490 Encounter for supervision of normal pregnancy, unspecified, unspecified trimester: Secondary | ICD-10-CM

## 2012-06-24 DIAGNOSIS — N898 Other specified noninflammatory disorders of vagina: Secondary | ICD-10-CM | POA: Insufficient documentation

## 2012-06-24 HISTORY — DX: Anemia, unspecified: D64.9

## 2012-06-24 LAB — BASIC METABOLIC PANEL
BUN: 10 mg/dL (ref 6–23)
Calcium: 9.5 mg/dL (ref 8.4–10.5)
GFR calc non Af Amer: >90 mL/min (ref >90)
Glucose, Bld: 113 mg/dL — ABNORMAL HIGH (ref 70–99)
Sodium: 138 mEq/L (ref 135–145)

## 2012-06-24 LAB — URINALYSIS, MICROSCOPIC ONLY
Glucose, UA: NEGATIVE mg/dL
Leukocytes, UA: NEGATIVE
Specific Gravity, Urine: 1.03 (ref 1.005–1.030)
pH: 6 (ref 5.0–8.0)

## 2012-06-24 LAB — CBC WITH DIFFERENTIAL/PLATELET
Eosinophils Absolute: 0.1 10*3/uL (ref 0.0–0.7)
Eosinophils Relative: 1 % (ref 0–5)
Lymphs Abs: 2.7 10*3/uL (ref 0.7–4.0)
MCH: 22.6 pg — ABNORMAL LOW (ref 26.0–34.0)
MCHC: 31.5 g/dL (ref 30.0–36.0)
MCV: 72 fL — ABNORMAL LOW (ref 78.0–100.0)
Platelets: 302 10*3/uL (ref 150–400)
RBC: 4.46 MIL/uL (ref 3.87–5.11)
RDW: 17.9 % — ABNORMAL HIGH (ref 11.5–15.5)

## 2012-06-24 LAB — WET PREP, GENITAL
Trich, Wet Prep: NONE SEEN
Yeast Wet Prep HPF POC: NONE SEEN

## 2012-06-24 NOTE — ED Notes (Signed)
Pt dc to home.  Pt ambulatory to exit without difficulty.  Pt states understanding to dc paperwork.  Jean Glass,

## 2012-06-24 NOTE — ED Provider Notes (Signed)
History     CSN: [id number]  Arrival date & time 06/24/12  1927   First MD Initiated Contact with Patient 06/24/12 2010      Chief Complaint  Patient presents with  . Abdominal Pain    (Consider location/radiation/quality/duration/timing/severity/associated sxs/prior treatment) HPI Pt reports several days of aching lower abdominal pain, blood in urine and fullness like she is about to begin her menses. She is approx 2 weeks late on her period. She denies any vomiting, diarrhea, or dysuria. No vaginal bleeding or discharge. She is P2G1A0.   Past Medical History  Diagnosis Date  . Anemia     History reviewed. No pertinent past surgical history.  History reviewed. No pertinent family history.  History  Substance Use Topics  . Smoking status: Never Smoker   . Smokeless tobacco: Not on file  . Alcohol Use: No    OB History    Grav Para Term Preterm Abortions TAB SAB Ect Mult Living                  Review of Systems All other systems reviewed and are negative except as noted in HPI.   Allergies  Review of patient's allergies indicates no known allergies.  Home Medications  No current outpatient prescriptions on file.  BP 117/67  Pulse 78  Temp 97.6 F (36.4 C) (Oral)  Resp 16  SpO2 100%  Physical Exam  Nursing note and vitals reviewed. Constitutional: She is oriented to person, place, and time. She appears well-developed and well-nourished.  HENT:  Head: Normocephalic and atraumatic.  Eyes: EOM are normal. Pupils are equal, round, and reactive to light.  Neck: Normal range of motion. Neck supple.  Cardiovascular: Normal rate, normal heart sounds and intact distal pulses.   Pulmonary/Chest: Effort normal and breath sounds normal.  Abdominal: Bowel sounds are normal. She exhibits no distension. There is tenderness (mild lower abdominal tenderness). There is no rebound and no guarding.  Genitourinary: Uterus is not tender. Cervix exhibits discharge. Cervix  exhibits no motion tenderness. Right adnexum displays no mass and no tenderness. Left adnexum displays no mass and no tenderness. No bleeding around the vagina. Vaginal discharge found.  Musculoskeletal: Normal range of motion. She exhibits no edema and no tenderness.  Neurological: She is alert and oriented to person, place, and time. She has normal strength. No cranial nerve deficit or sensory deficit.  Skin: Skin is warm and dry. No rash noted.  Psychiatric: She has a normal mood and affect.    ED Course  Procedures (including critical care time)  Labs Reviewed  URINALYSIS, MICROSCOPIC ONLY - Abnormal; Notable for the following:    APPearance CLOUDY (*)     Nitrite POSITIVE (*)     Bacteria, UA MANY (*)     All other components within normal limits  POCT PREGNANCY, URINE - Abnormal; Notable for the following:    Preg Test, Ur POSITIVE (*)     All other components within normal limits  CBC WITH DIFFERENTIAL - Abnormal; Notable for the following:    WBC 13.0 (*)     Hemoglobin 10.1 (*)     HCT 32.1 (*)     MCV 72.0 (*)     MCH 22.6 (*)     RDW 17.9 (*)     Neutro Abs 9.3 (*)     All other components within normal limits  BASIC METABOLIC PANEL - Abnormal; Notable for the following:    Glucose, Bld 113 (*)  All other components within normal limits  HCG, QUANTITATIVE, PREGNANCY - Abnormal; Notable for the following:    hCG, Beta Chain, Quant, S 14177 (*)     All other components within normal limits  WET PREP, GENITAL - Abnormal; Notable for the following:    Clue Cells Wet Prep HPF POC FEW (*)     All other components within normal limits  ABO/RH  URINE CULTURE  GC/CHLAMYDIA PROBE AMP, GENITAL   US Ob Comp Less 14 Wks  06/24/2012  *RADIOLOGY REPORT*  Clinical Data: Pelvic pain and cramping.  OBSTETRIC <14 WK [place] AND TRANSVAGINAL OB US  Technique:  Both transabdominal and transvaginal ultrasound examinations were performed for complete evaluation of the gestation as well  as the maternal uterus, adnexal regions, and pelvic cul-de-sac.  Transvaginal technique was performed to assess early pregnancy.  Comparison:  None.  Intrauterine gestational sac:  Visualized/normal in shape. Yolk sac: Present Embryo: Present Cardiac Activity: Present Heart Rate: 108 bpm  CRL: 2.7  mm  5 w  6 d        [place] EDC: 02/18/2013  Maternal uterus/adnexae: Normal appearance of the right ovary with small follicles.  Right ovary measures 2.7 x 1.4 x 2.4 cm.  Normal appearance of the left ovary measuring 3.4 x 2.2 x 2.8 cm.  There may be an echogenic corpus luteum within the left ovary, measuring up to 2.2 cm. There appears to be some echogenic fluid within the endometrial cavity.  IMPRESSION: Single live intrauterine pregnancy.  Calculated gestational age by ultrasound is 5 weeks and 6 days.  There appears be a small amount of echogenic fluid within the endometrial cavity.   Original Report Authenticated By: Richarda Overlie, M.D.    US Ob Transvaginal  06/24/2012  *RADIOLOGY REPORT*  Clinical Data: Pelvic pain and cramping.  OBSTETRIC <14 WK [place] AND TRANSVAGINAL OB US  Technique:  Both transabdominal and transvaginal ultrasound examinations were performed for complete evaluation of the gestation as well as the maternal uterus, adnexal regions, and pelvic cul-de-sac.  Transvaginal technique was performed to assess early pregnancy.  Comparison:  None.  Intrauterine gestational sac:  Visualized/normal in shape. Yolk sac: Present Embryo: Present Cardiac Activity: Present Heart Rate: 108 bpm  CRL: 2.7  mm  5 w  6 d        [place] EDC: 02/18/2013  Maternal uterus/adnexae: Normal appearance of the right ovary with small follicles.  Right ovary measures 2.7 x 1.4 x 2.4 cm.  Normal appearance of the left ovary measuring 3.4 x 2.2 x 2.8 cm.  There may be an echogenic corpus luteum within the left ovary, measuring up to 2.2 cm. There appears to be some echogenic fluid within the endometrial cavity.  IMPRESSION: Single live  intrauterine pregnancy.  Calculated gestational age by ultrasound is 5 weeks and 6 days.  There appears be a small amount of echogenic fluid within the endometrial cavity.   Original Report Authenticated By: Richarda Overlie, M.D.      No diagnosis found.    MDM  Pt with positive preg, sent for [place] to rule out ectopic.  10:48 PM [place] as above shows IUP consistent with dates and Quant. Pt advised Ob followup. Prenatal vitamin OTC. Avoid tobacco, drugs or alcohol.       Charles B. Bernette Mayers, MD 06/24/12 2249

## 2012-06-24 NOTE — ED Notes (Signed)
Pt reports lower abdominal pain that is on the right side now, but was generalized all over last night began last night.   Pain is worse with deep inspiration and associated with hematuria. Pain is described as cramping. Denies pain with urination but c/o feeling the urge to urinate but inability to urinate.  Reports slight nausea.  LMP 05/16/2012

## 2012-06-26 LAB — URINE CULTURE

## 2012-06-27 ENCOUNTER — Encounter (HOSPITAL_COMMUNITY): Payer: Self-pay | Admitting: *Deleted

## 2012-06-27 ENCOUNTER — Inpatient Hospital Stay (HOSPITAL_COMMUNITY)
Admission: AD | Admit: 2012-06-27 | Discharge: 2012-06-27 | Disposition: A | Discharge status: Home / Self Care | Payer: Self-pay | Source: Ambulatory Visit | Attending: Obstetrics and Gynecology | Admitting: Obstetrics and Gynecology

## 2012-06-27 DIAGNOSIS — R350 Frequency of micturition: Secondary | ICD-10-CM | POA: Insufficient documentation

## 2012-06-27 DIAGNOSIS — N390 Urinary tract infection, site not specified: Secondary | ICD-10-CM | POA: Insufficient documentation

## 2012-06-27 DIAGNOSIS — O2341 Unspecified infection of urinary tract in pregnancy, first trimester: Secondary | ICD-10-CM

## 2012-06-27 DIAGNOSIS — O2390 Unspecified genitourinary tract infection in pregnancy, unspecified trimester: Secondary | ICD-10-CM | POA: Insufficient documentation

## 2012-06-27 LAB — URINALYSIS, ROUTINE W REFLEX MICROSCOPIC
Bilirubin Urine: NEGATIVE
Leukocytes, UA: NEGATIVE
Nitrite: POSITIVE — AB
Specific Gravity, Urine: >1.03 — ABNORMAL HIGH (ref 1.005–1.030)
Urobilinogen, UA: 0.2 mg/dL (ref 0.0–1.0)
pH: 6 (ref 5.0–8.0)

## 2012-06-27 LAB — URINE MICROSCOPIC-ADD ON

## 2012-06-27 MED ORDER — PROMETHAZINE HCL 25 MG PO TABS: 25.0000 mg | ORAL_TABLET | Freq: Four times a day (QID) | ORAL | Status: DC | PRN

## 2012-06-27 MED ORDER — CEPHALEXIN 500 MG PO CAPS: 500.0000 mg | ORAL_CAPSULE | Freq: Four times a day (QID) | ORAL | Status: DC

## 2012-06-27 NOTE — MAU Provider Note (Signed)
Attestation of Attending Supervision of Advanced Practitioner (CNM/NP): Evaluation and management procedures were performed by the Advanced Practitioner under my supervision and collaboration.  I have reviewed the Advanced Practitioner's note and chart, and I agree with the management and plan.  Plato Alspaugh 06/27/2012 4:40 PM   

## 2012-06-27 NOTE — MAU Provider Note (Signed)
History     CSN: [id number]  Arrival date and time: 06/27/12 1422   First Provider Initiated Contact with Patient 06/27/12 1501      Chief Complaint  Patient presents with  . Morning Sickness   HPI This is a 26 y.o. female at [redacted]w[redacted]d who presents with c/o urinary frequency, bladder pain and dysuria. Was seen two days ago and UA showed + nitrites but patient was not treated.  No bleeding. Has some nausea. Plans to go to Dr Gaynell Face when [facility] comes in.   OB History    Grav Para Term Preterm Abortions TAB SAB Ect Mult Living   2 1 1       1       Past Medical History  Diagnosis Date  . Anemia     Past Surgical History  Procedure Date  . No past surgeries   . Cesarean section     Family History  Problem Relation Age of Onset  . Other Neg Hx     History  Substance Use Topics  . Smoking status: Never Smoker   . Smokeless tobacco: Not on file  . Alcohol Use: No    Allergies: No Known Allergies  No prescriptions prior to admission    ROS See HPI  Physical Exam   Blood pressure 129/64, pulse 83, temperature 97.1 F (36.2 C), temperature source Oral, resp. rate 16, last menstrual period 05/16/2012.  Physical Exam  Constitutional: She is oriented to person, place, and time. She appears well-developed and well-nourished. No distress.  Cardiovascular: Normal rate.   Respiratory: Effort normal.  GI: Soft. She exhibits no distension and no mass. There is no tenderness. There is no rebound and no guarding.       No CVA tenderness  Musculoskeletal: Normal range of motion.  Neurological: She is alert and oriented to person, place, and time.  Skin: Skin is warm and dry.  Psychiatric: She has a normal mood and affect.   Results for orders placed during the hospital encounter of 06/27/12 (from the past 72 hour(s))  URINALYSIS, ROUTINE W REFLEX MICROSCOPIC     Status: Abnormal   Collection Time   06/27/12  2:30 PM      Component Value Range Comment   Color, Urine  YELLOW  YELLOW    APPearance CLEAR  CLEAR    Specific Gravity, Urine >1.030 (*) 1.005 - 1.030    pH 6.0  5.0 - 8.0    Glucose, UA NEGATIVE  NEGATIVE mg/dL    Hgb urine dipstick NEGATIVE  NEGATIVE    Bilirubin Urine NEGATIVE  NEGATIVE    Ketones, ur NEGATIVE  NEGATIVE mg/dL    Protein, ur NEGATIVE  NEGATIVE mg/dL    Urobilinogen, UA 0.2  0.0 - 1.0 mg/dL    Nitrite POSITIVE (*) NEGATIVE    Leukocytes, UA NEGATIVE  NEGATIVE   URINE MICROSCOPIC-ADD ON     Status: Abnormal   Collection Time   06/27/12  2:30 PM      Component Value Range Comment   Squamous Epithelial / LPF RARE  RARE    WBC, UA 3-6  <3 WBC/hpf    RBC / HPF 0-2  <3 RBC/hpf    Bacteria, UA MANY (*) RARE    [place] two days ago showed viable IUP.  MAU Course  Procedures  MDM Urine Culture showed E Coli, sensitive to Ancef, but resistant to several meds. Will Rx Keflex and Phenergan.  Assessment and Plan  A:  SIUP at [redacted]w[redacted]d  Urinary Tract Infection  P:  Discharge      Rx Keflex      Rx Phenergan      Begin prenatal care  [facility] 06/27/2012, 3:09 PM

## 2012-06-27 NOTE — MAU Note (Signed)
C/o nausea for past 4-5 days; c/o pressure over her bladder area with pain increased after voiding;

## 2012-06-27 NOTE — ED Notes (Signed)
+   urine  Patient treated appropriately with keflex -sensitive to same-chart appended per protocol MD.

## 2012-06-29 LAB — URINE CULTURE

## 2012-07-31 ENCOUNTER — Inpatient Hospital Stay (HOSPITAL_COMMUNITY)
Admission: AD | Admit: 2012-07-31 | Discharge: 2012-07-31 | Disposition: A | Discharge status: Home / Self Care | Payer: Medicaid Other | Source: Ambulatory Visit | Attending: Obstetrics & Gynecology | Admitting: Obstetrics & Gynecology

## 2012-07-31 ENCOUNTER — Encounter (HOSPITAL_COMMUNITY): Payer: Self-pay | Admitting: Family

## 2012-07-31 DIAGNOSIS — O26899 Other specified pregnancy related conditions, unspecified trimester: Secondary | ICD-10-CM

## 2012-07-31 DIAGNOSIS — R109 Unspecified abdominal pain: Secondary | ICD-10-CM | POA: Insufficient documentation

## 2012-07-31 DIAGNOSIS — D649 Anemia, unspecified: Secondary | ICD-10-CM | POA: Insufficient documentation

## 2012-07-31 DIAGNOSIS — K5900 Constipation, unspecified: Secondary | ICD-10-CM | POA: Insufficient documentation

## 2012-07-31 DIAGNOSIS — O99891 Other specified diseases and conditions complicating pregnancy: Secondary | ICD-10-CM | POA: Insufficient documentation

## 2012-07-31 DIAGNOSIS — O99019 Anemia complicating pregnancy, unspecified trimester: Secondary | ICD-10-CM | POA: Insufficient documentation

## 2012-07-31 LAB — URINALYSIS, ROUTINE W REFLEX MICROSCOPIC
Bilirubin Urine: NEGATIVE
Leukocytes, UA: NEGATIVE
Nitrite: NEGATIVE
Specific Gravity, Urine: >1.03 — ABNORMAL HIGH (ref 1.005–1.030)
Urobilinogen, UA: 0.2 mg/dL (ref 0.0–1.0)
pH: 6 (ref 5.0–8.0)

## 2012-07-31 LAB — CBC WITH DIFFERENTIAL/PLATELET
Basophils Relative: 0 % (ref 0–1)
Eosinophils Absolute: 0.1 10*3/uL (ref 0.0–0.7)
Hemoglobin: 9.8 g/dL — ABNORMAL LOW (ref 12.0–15.0)
Lymphs Abs: 1.4 10*3/uL (ref 0.7–4.0)
MCH: 22.4 pg — ABNORMAL LOW (ref 26.0–34.0)
Neutro Abs: 10.4 10*3/uL — ABNORMAL HIGH (ref 1.7–7.7)
Neutrophils Relative %: 82 % — ABNORMAL HIGH (ref 43–77)
Platelets: 265 10*3/uL (ref 150–400)
RBC: 4.37 MIL/uL (ref 3.87–5.11)

## 2012-07-31 MED ORDER — ACETAMINOPHEN 325 MG PO TABS
650.0000 mg | ORAL_TABLET | Freq: Once | ORAL | Status: AC
  Administered 2012-07-31: 650 mg via ORAL
  Filled 2012-07-31: qty 2

## 2012-07-31 NOTE — MAU Provider Note (Signed)
History     CSN: [id number]  Arrival date and time: 07/31/12 1191   First Provider Initiated Contact with Patient 07/31/12 [phone]      Chief Complaint  Patient presents with  . Abdominal Pain   HPI Jean Glass 26 y.o. [redacted]w[redacted]d  Comes to MAU today with lower abdominal pain for 1.5 hours.  No bleeding.  No vomiting.  No diarrhea.  Has not had a BM in 2 days.  Had a UTI previously in this pregnancy.  OB History    Grav Para Term Preterm Abortions TAB SAB Ect Mult Living   2 1 1       1       Past Medical History  Diagnosis Date  . Anemia     Past Surgical History  Procedure Date  . No past surgeries   . Cesarean section     Family History  Problem Relation Age of Onset  . Other Neg Hx     History  Substance Use Topics  . Smoking status: Never Smoker   . Smokeless tobacco: Not on file  . Alcohol Use: No    Allergies: No Known Allergies  Prescriptions prior to admission  Medication Sig Dispense Refill  . PRENATAL VITAMINS PO Take by mouth.      . cephALEXin (KEFLEX) 500 MG capsule Take 1 capsule (500 mg total) by mouth 4 (four) times daily.  28 capsule  0  . promethazine (PHENERGAN) 25 MG tablet Take 1 tablet (25 mg total) by mouth every 6 (six) hours as needed for nausea.  30 tablet  0    Review of Systems  Constitutional: Negative for fever.  Gastrointestinal: Positive for abdominal pain and constipation. Negative for nausea, vomiting and diarrhea.  Genitourinary:       No vaginal discharge. No vaginal bleeding. No dysuria.   Physical Exam   Blood pressure 126/74, pulse 98, temperature 97.1 F (36.2 C), temperature source Oral, resp. rate 18, height 5\' 3"  (1.6 m), weight 86.002 kg (189 lb 9.6 oz), last menstrual period 05/16/2012, SpO2 100.00%.  Physical Exam  Nursing note and vitals reviewed. Constitutional: She is oriented to person, place, and time. She appears well-developed and well-nourished.  HENT:  Head: Normocephalic.  Eyes: EOM are  normal.  Neck: Neck supple.  GI: Soft. Bowel sounds are normal. There is tenderness. There is no rebound and no guarding.       Mild diffuse tenderness all across lower abdomen.  Genitourinary:       Cervix closed and thick. No bleeding. No stool palpated in the recturm.  Musculoskeletal: Normal range of motion.  Neurological: She is alert and oriented to person, place, and time.  Skin: Skin is warm and dry.  Psychiatric: She has a normal mood and affect.    MAU Course  Procedures Results for orders placed during the hospital encounter of 07/31/12 (from the past 24 hour(s))  URINALYSIS, ROUTINE W REFLEX MICROSCOPIC     Status: Abnormal   Collection Time   07/31/12  7:00 AM      Component Value Range   Color, Urine YELLOW  YELLOW   APPearance CLEAR  CLEAR   Specific Gravity, Urine >1.030 (*) 1.005 - 1.030   pH 6.0  5.0 - 8.0   Glucose, UA NEGATIVE  NEGATIVE mg/dL   Hgb urine dipstick NEGATIVE  NEGATIVE   Bilirubin Urine NEGATIVE  NEGATIVE   Ketones, ur NEGATIVE  NEGATIVE mg/dL   Protein, ur NEGATIVE  NEGATIVE mg/dL  Urobilinogen, UA 0.2  0.0 - 1.0 mg/dL   Nitrite NEGATIVE  NEGATIVE   Leukocytes, UA NEGATIVE  NEGATIVE  CBC WITH DIFFERENTIAL     Status: Abnormal   Collection Time   07/31/12  7:45 AM      Component Value Range   WBC 12.6 (*) 4.0 - 10.5 K/uL   RBC 4.37  3.87 - 5.11 MIL/uL   Hemoglobin 9.8 (*) 12.0 - 15.0 g/dL   HCT 40.9 (*) 81.1 - 91.4 %   MCV 72.1 (*) 78.0 - 100.0 fL   MCH 22.4 (*) 26.0 - 34.0 pg   MCHC 31.1  30.0 - 36.0 g/dL   RDW 78.2 (*) 95.6 - 21.3 %   Platelets 265  150 - 400 K/uL   Neutrophils Relative 82 (*) 43 - 77 %   Neutro Abs 10.4 (*) 1.7 - 7.7 K/uL   Lymphocytes Relative 11 (*) 12 - 46 %   Lymphs Abs 1.4  0.7 - 4.0 K/uL   Monocytes Relative 5  3 - 12 %   Monocytes Absolute 0.7  0.1 - 1.0 K/uL   Eosinophils Relative 1  0 - 5 %   Eosinophils Absolute 0.1  0.0 - 0.7 K/uL   Basophils Relative 0  0 - 1 %   Basophils Absolute 0.0  0.0 - 0.1  K/uL    MDM GC/Chlam done one month ago - negative.  Urine does not show any infection.  Mild dehyration as noted from specific gravity.  Currently patient reports pain is less than when she arrived in MAU.  Discussed high fiber diet and appropriate hydration.  Will give Tylenol here as she has not taken any today.  Assessment and Plan  Lower abdominal pain in pregnancy Constipation Anemia  Plan Drink at least 8 8-oz glasses of water every day. Take Tylenol 325 mg 2 tablets by mouth every 4 hours if needed for pain. May use an over the counter stool softener daily.   High fiber diet. Begin prenatal care as soon as possible Continue prenatal vitamins.  Rhiannon Sassaman 07/31/2012, 9:04 AM

## 2012-07-31 NOTE — MAU Note (Signed)
Pt reports generalized lower abdominal pain x 1.5 hrs. States it first felt like gas pain but was unable to use BR. LBM 2 days ago. Denies vaginal bleeding/

## 2012-07-31 NOTE — MAU Note (Signed)
Pt reports pain in her lower abd that feels like contractions x 1 hour, has not had a BM in 2 days. Denies vaginal bleeding

## 2012-07-31 NOTE — MAU Provider Note (Signed)
Chart reviewed and agree with management and plan.  

## 2012-09-04 NOTE — L&D Delivery Note (Signed)
Delivery Note At 1:35 PM a viable and healthy female was delivered via VBAC, Spontaneous (Presentation: Left Occiput Anterior).  APGAR: 9, 9; weight pending.   Placenta status: Intact, Spontaneous.  Cord: 3 vessels   Anesthesia: Epidural  Episiotomy: None Lacerations: Labial Suture Repair: vicryl 4-0 Est. Blood Loss (mL):  300cc   Mom to postpartum.  Baby to nursery-stable.  Jean Decatur H. 02/07/2013, 1:52 PM

## 2012-09-10 ENCOUNTER — Other Ambulatory Visit: Payer: Self-pay

## 2012-09-10 LAB — OB RESULTS CONSOLE RUBELLA ANTIBODY, IGM: Rubella: IMMUNE

## 2012-09-10 LAB — OB RESULTS CONSOLE HIV ANTIBODY (ROUTINE TESTING): HIV: NONREACTIVE

## 2012-09-10 LAB — OB RESULTS CONSOLE HEPATITIS B SURFACE ANTIGEN: Hepatitis B Surface Ag: NEGATIVE

## 2012-10-01 ENCOUNTER — Encounter: Payer: Self-pay | Admitting: Obstetrics & Gynecology

## 2012-10-19 ENCOUNTER — Other Ambulatory Visit: Payer: Self-pay

## 2012-11-12 ENCOUNTER — Encounter (HOSPITAL_COMMUNITY): Payer: Self-pay | Admitting: *Deleted

## 2012-11-12 ENCOUNTER — Inpatient Hospital Stay (HOSPITAL_COMMUNITY)
Admission: AD | Admit: 2012-11-12 | Discharge: 2012-11-12 | Disposition: A | Discharge status: Home / Self Care | Payer: 59 | Source: Ambulatory Visit | Attending: Obstetrics & Gynecology | Admitting: Obstetrics & Gynecology

## 2012-11-12 DIAGNOSIS — R109 Unspecified abdominal pain: Secondary | ICD-10-CM | POA: Insufficient documentation

## 2012-11-12 DIAGNOSIS — N390 Urinary tract infection, site not specified: Secondary | ICD-10-CM | POA: Insufficient documentation

## 2012-11-12 DIAGNOSIS — O2390 Unspecified genitourinary tract infection in pregnancy, unspecified trimester: Secondary | ICD-10-CM | POA: Insufficient documentation

## 2012-11-12 LAB — URINALYSIS, ROUTINE W REFLEX MICROSCOPIC
Protein, ur: NEGATIVE mg/dL
Specific Gravity, Urine: >1.03 — ABNORMAL HIGH (ref 1.005–1.030)
Urobilinogen, UA: 0.2 mg/dL (ref 0.0–1.0)

## 2012-11-12 LAB — URINE MICROSCOPIC-ADD ON

## 2012-11-12 MED ORDER — NITROFURANTOIN MONOHYD MACRO 100 MG PO CAPS: 100.0000 mg | ORAL_CAPSULE | Freq: Two times a day (BID) | ORAL | Status: DC

## 2012-11-12 NOTE — MAU Note (Signed)
Pain in abdomen started Saturday. Worse today and worse when walks. No bleeding or d/c

## 2012-11-12 NOTE — MAU Provider Note (Signed)
I asked about her unrinanalysis and was told she had many WBC's.  Had I known that she had many bacteria but only 3-6 WBC's I would not have agreed with the diagnosis of UTI.

## 2012-11-12 NOTE — MAU Provider Note (Signed)
Chief Complaint:  Abdominal Pain   First Provider Initiated Contact with Patient 11/12/12 1619      HPI: Jean Glass is a 27 y.o. G2P1001 at [redacted]w[redacted]d who presents to maternity admissions reporting constant lower abdominal pressure, made worse when standing and walking.  She reports good fetal movement, denies LOF, vaginal bleeding, vaginal itching/burning, urinary symptoms, h/a, dizziness, n/v, or fever/chills.    Past Medical History: Past Medical History  Diagnosis Date  . Anemia     Past obstetric history: OB History   Grav Para Term Preterm Abortions TAB SAB Ect Mult Living   2 1 1       1      # Outc Date GA Lbr Len/2nd Wgt Sex Del Anes PTL Lv   1 TRM            2 CUR               Past Surgical History: Past Surgical History  Procedure Laterality Date  . No past surgeries    . Cesarean section      Family History: Family History  Problem Relation Age of Onset  . Other Neg Hx   . Hypertension Mother   . Stroke Maternal Grandmother   . Hypertension Maternal Grandmother     Social History: History  Substance Use Topics  . Smoking status: Never Smoker   . Smokeless tobacco: Not on file  . Alcohol Use: No    Allergies: No Known Allergies  Meds:  Prescriptions prior to admission  Medication Sig Dispense Refill  . Prenatal Vit-Fe Fumarate-FA (PRENATAL MULTIVITAMIN) TABS Take 1 tablet by mouth daily at 12 noon.      . [DISCONTINUED] ibuprofen (ADVIL,MOTRIN) 200 MG tablet Take 400 mg by mouth every 6 (six) hours as needed for pain. headache and stomach cramping      . promethazine (PHENERGAN) 25 MG tablet Take 1 tablet (25 mg total) by mouth every 6 (six) hours as needed for nausea.  30 tablet  0  . [DISCONTINUED] cephALEXin (KEFLEX) 500 MG capsule Take 1 capsule (500 mg total) by mouth 4 (four) times daily.  28 capsule  0  . [DISCONTINUED] PRENATAL VITAMINS PO Take by mouth.        ROS: Pertinent findings in history of present illness.  Physical Exam   Blood pressure 132/77, pulse 103, temperature 98.2 F (36.8 C), temperature source Oral, resp. rate 20, height 5\' 3"  (1.6 m), weight 87.998 kg (194 lb), last menstrual period 05/16/2012. GENERAL: Well-developed, well-nourished female in no acute distress.  HEENT: normocephalic HEART: normal rate RESP: normal effort ABDOMEN: Soft, non-tender, gravid appropriate for gestational age EXTREMITIES: Nontender, no edema NEURO: alert and oriented  Dilation: Closed Effacement (%): Thick Cervical Position: Posterior Exam by:: Sharen Counter CNM  FHT:  Baseline 140, moderate variability, accelerations present, variable decelerations appropriate for gestation when EFM first applied, resolved during time in MAU with PO fluids Contractions: some irritability   Labs: Results for orders placed during the hospital encounter of 11/12/12 (from the past 24 hour(s))  URINALYSIS, ROUTINE W REFLEX MICROSCOPIC     Status: Abnormal   Collection Time    11/12/12  3:00 PM      Result Value Range   Color, Urine YELLOW  YELLOW   APPearance HAZY (*) CLEAR   Specific Gravity, Urine >1.030 (*) 1.005 - 1.030   pH 6.0  5.0 - 8.0   Glucose, UA NEGATIVE  NEGATIVE mg/dL   Hgb urine dipstick NEGATIVE  NEGATIVE   Bilirubin Urine NEGATIVE  NEGATIVE   Ketones, ur NEGATIVE  NEGATIVE mg/dL   Protein, ur NEGATIVE  NEGATIVE mg/dL   Urobilinogen, UA 0.2  0.0 - 1.0 mg/dL   Nitrite NEGATIVE  NEGATIVE   Leukocytes, UA SMALL (*) NEGATIVE  URINE MICROSCOPIC-ADD ON     Status: Abnormal   Collection Time    11/12/12  3:00 PM      Result Value Range   Squamous Epithelial / LPF RARE  RARE   WBC, UA 3-6  <3 WBC/hpf   RBC / HPF 0-2  <3 RBC/hpf   Bacteria, UA MANY (*) RARE   Urine-Other MUCOUS PRESENT      Assessment: 1. UTI in pregnancy, second trimester     Plan: Discharge home Macrobid BID x7 days PTL precautions F/U with Dr Arlyce Dice Return to MAU as needed   Follow-up Information   Follow up with  Mickel Baas, MD. (Return to MAU as needed.)    Contact information:   [address] [place] [address] [phone]       Sharen Counter Certified Nurse-Midwife 11/12/2012 4:37 PM

## 2012-11-12 NOTE — Progress Notes (Signed)
Written and verbal d/c instructions given and understanding voiced. 

## 2012-11-14 LAB — URINE CULTURE: Colony Count: >=100000

## 2012-12-15 ENCOUNTER — Encounter (HOSPITAL_COMMUNITY): Payer: Self-pay | Admitting: *Deleted

## 2012-12-15 ENCOUNTER — Inpatient Hospital Stay (HOSPITAL_COMMUNITY)
Admission: EM | Admit: 2012-12-15 | Discharge: 2012-12-16 | Disposition: A | Discharge status: Home / Self Care | Payer: 59 | Attending: Obstetrics and Gynecology | Admitting: Obstetrics and Gynecology

## 2012-12-15 DIAGNOSIS — K089 Disorder of teeth and supporting structures, unspecified: Secondary | ICD-10-CM | POA: Insufficient documentation

## 2012-12-15 DIAGNOSIS — K0889 Other specified disorders of teeth and supporting structures: Secondary | ICD-10-CM

## 2012-12-15 DIAGNOSIS — N390 Urinary tract infection, site not specified: Secondary | ICD-10-CM | POA: Diagnosis not present

## 2012-12-15 DIAGNOSIS — O4700 False labor before 37 completed weeks of gestation, unspecified trimester: Secondary | ICD-10-CM | POA: Insufficient documentation

## 2012-12-15 DIAGNOSIS — O4703 False labor before 37 completed weeks of gestation, third trimester: Secondary | ICD-10-CM

## 2012-12-15 DIAGNOSIS — O2390 Unspecified genitourinary tract infection in pregnancy, unspecified trimester: Secondary | ICD-10-CM | POA: Diagnosis not present

## 2012-12-15 DIAGNOSIS — K029 Dental caries, unspecified: Secondary | ICD-10-CM | POA: Diagnosis not present

## 2012-12-15 DIAGNOSIS — O479 False labor, unspecified: Secondary | ICD-10-CM

## 2012-12-15 DIAGNOSIS — O99891 Other specified diseases and conditions complicating pregnancy: Secondary | ICD-10-CM | POA: Diagnosis not present

## 2012-12-15 NOTE — Progress Notes (Signed)
Patient states she has been having a tooth ache for past 3 days.  She states she started contracting at 8:00 pm tonight. Denies any ROM, bleeding and good fetal movement. Last intercourse 2 days ago.  Denies any VD, edema, etc.

## 2012-12-15 NOTE — Progress Notes (Signed)
Dr. Claiborne Billings notified of patient ED visit. No obstetric orders, awaiting ED medical evaluation. Will continue to monitor. Patient states UCs are less frequent.

## 2012-12-15 NOTE — ED Notes (Signed)
OB Rapid Response at bedside.   

## 2012-12-15 NOTE — ED Notes (Signed)
Patient presents with 2 complaints:  1) right lower dental pain x 3 days. States that she has a "bad" wisdom tooth.  2) G2P1 c/o contractions since 2000. Denies vaginal discharge or bleeding. OB rapid response at bedside

## 2012-12-15 NOTE — ED Notes (Signed)
Pt c/o severe dental pain x 3 days.  Also states she has been having contractions x 1 hour, states they are 5-10 minutes apart and feels them in her lower abdomen.  States she is [redacted] weeks pregnant.  Denies vaginal discharge/bleeding.

## 2012-12-15 NOTE — ED Notes (Signed)
Placed call for OB Rapid Response RN

## 2012-12-16 DIAGNOSIS — O4700 False labor before 37 completed weeks of gestation, unspecified trimester: Secondary | ICD-10-CM | POA: Diagnosis not present

## 2012-12-16 DIAGNOSIS — O479 False labor, unspecified: Secondary | ICD-10-CM

## 2012-12-16 LAB — URINALYSIS, ROUTINE W REFLEX MICROSCOPIC
Bilirubin Urine: NEGATIVE
Ketones, ur: 40 mg/dL — AB
Nitrite: POSITIVE — AB
Protein, ur: NEGATIVE mg/dL
Urobilinogen, UA: 0.2 mg/dL (ref 0.0–1.0)

## 2012-12-16 LAB — URINE MICROSCOPIC-ADD ON

## 2012-12-16 LAB — GLUCOSE, CAPILLARY: Glucose-Capillary: 75 mg/dL (ref 70–99)

## 2012-12-16 MED ORDER — LACTATED RINGERS IV BOLUS (SEPSIS)
1000.0000 mL | Freq: Once | INTRAVENOUS | Status: AC
  Administered 2012-12-16: 1000 mL via INTRAVENOUS

## 2012-12-16 MED ORDER — TERBUTALINE SULFATE 1 MG/ML IJ SOLN
0.2500 mg | Freq: Once | INTRAMUSCULAR | Status: AC
  Administered 2012-12-16: 0.25 mg via SUBCUTANEOUS
  Filled 2012-12-16: qty 1

## 2012-12-16 MED ORDER — CEPHALEXIN 250 MG PO CAPS
500.0000 mg | ORAL_CAPSULE | Freq: Once | ORAL | Status: AC
  Administered 2012-12-16: 500 mg via ORAL
  Filled 2012-12-16: qty 2

## 2012-12-16 MED ORDER — CEPHALEXIN 500 MG PO CAPS: 500.0000 mg | ORAL_CAPSULE | Freq: Four times a day (QID) | ORAL | Status: DC

## 2012-12-16 MED ORDER — ACETAMINOPHEN 500 MG PO TABS
1000.0000 mg | ORAL_TABLET | Freq: Once | ORAL | Status: AC
  Administered 2012-12-16: 1000 mg via ORAL
  Filled 2012-12-16: qty 2

## 2012-12-16 MED ORDER — CEPHALEXIN 500 MG PO CAPS: 500.0000 mg | ORAL_CAPSULE | Freq: Two times a day (BID) | ORAL | Status: DC

## 2012-12-16 NOTE — MAU Provider Note (Signed)
History     CSN: [id number]  Arrival date and time: 12/15/12 2139   None     Chief Complaint  Patient presents with  . Dental Pain  . Contractions   HPI  Jean Glass is a 27 y.o. G2P1001 at [redacted]w[redacted]d who is here from a [facility]. She presented there because she had an infected tooth that was bothering her. She was noted to have been contracting, and the decision was made to transfer here for further monitoring. She states that the contractions seem to have "slowed" since leaving the ED. +FM, -VB, -LOF. Dr. Claiborne Billings notified of patients arrival. Plan to IV hydrate, monitor UCs and recheck cervix. If no change she can go home. If any change noted we will give BMZ and have her come back tomorrow for the 2nd dose.   Past Medical History  Diagnosis Date  . Anemia     Past Surgical History  Procedure Laterality Date  . No past surgeries    . Cesarean section      Family History  Problem Relation Age of Onset  . Other Neg Hx   . Hypertension Mother   . Stroke Maternal Grandmother   . Hypertension Maternal Grandmother     History  Substance Use Topics  . Smoking status: Never Smoker   . Smokeless tobacco: Not on file  . Alcohol Use: No    Allergies: No Known Allergies  Prescriptions prior to admission  Medication Sig Dispense Refill  . Prenatal Vit-Fe Fumarate-FA (PRENATAL MULTIVITAMIN) TABS Take 1 tablet by mouth daily at 12 noon.        Review of Systems  Constitutional: Negative for fever.  Eyes: Negative for blurred vision.  Gastrointestinal: Negative for nausea, vomiting, abdominal pain, diarrhea and constipation.  Genitourinary: Negative for dysuria, urgency and frequency.  Neurological: Negative for dizziness and headaches.   Physical Exam   Blood pressure 140/79, pulse 116, temperature 97.9 F (36.6 C), temperature source Oral, resp. rate 18, height 5\' 3"  (1.6 m), weight 93 lb (42.185 kg), last menstrual period 05/16/2012, SpO2 100.00%.  Physical Exam   Nursing note and vitals reviewed. Constitutional: She is oriented to person, place, and time. She appears well-developed and well-nourished.  Cardiovascular: Normal rate.   GI: Soft. She exhibits no distension. There is no tenderness.  Neurological: She is alert and oriented to person, place, and time.  Skin: Skin is warm and dry.  Psychiatric: She has a normal mood and affect.    MAU Course  Procedures  Results for orders placed during the hospital encounter of 12/15/12 (from the past 24 hour(s))  URINALYSIS, ROUTINE W REFLEX MICROSCOPIC     Status: Abnormal   Collection Time    12/16/12 12:30 AM      Result Value Range   Color, Urine AMBER (*) YELLOW   APPearance CLOUDY (*) CLEAR   Specific Gravity, Urine 1.026  1.005 - 1.030   pH 6.0  5.0 - 8.0   Glucose, UA NEGATIVE  NEGATIVE mg/dL   Hgb urine dipstick NEGATIVE  NEGATIVE   Bilirubin Urine NEGATIVE  NEGATIVE   Ketones, ur 40 (*) NEGATIVE mg/dL   Protein, ur NEGATIVE  NEGATIVE mg/dL   Urobilinogen, UA 0.2  0.0 - 1.0 mg/dL   Nitrite POSITIVE (*) NEGATIVE   Leukocytes, UA NEGATIVE  NEGATIVE  URINE MICROSCOPIC-ADD ON     Status: Abnormal   Collection Time    12/16/12 12:30 AM      Result Value Range  Squamous Epithelial / LPF FEW (*) RARE   WBC, UA 3-6  <3 WBC/hpf   RBC / HPF 0-2  <3 RBC/hpf   Bacteria, UA MANY (*) RARE   Urine-Other MUCOUS PRESENT     FHT: CatI Toco: UCs have stopped.   0400: repeat cervical exam; 1 at ext os, closed at int os/thick/high/posterior. No change from previous exam     Assessment and Plan   1. Premature uterine contractions causing threatened premature labor, third trimester   2. Dental caries   3. Tooth pain   4. UTI (lower urinary tract infection)   5. Braxton Hicks contractions    RX keflex 500mg  QID x 7 DAYS FU with PCP as scheduled.   Tawnya Crook 12/16/2012, 3:16 AM

## 2012-12-16 NOTE — ED Provider Notes (Signed)
History     CSN: [id number]  Arrival date & time 12/15/12  2139   First MD Initiated Contact with Patient 12/16/12 0019      Chief Complaint  Patient presents with  . Dental Pain  . Contractions   HPI Jean Glass is a 27 y.o. female presenting at 30 weeks 4 days pregnancy she is a G2 P20 with a 50-year-old son, she presents with pain in her right rear wisdom tooth, she says some of this tooth is broken off and is hurting her to eat. She has not noticed any bad taste in her mouth, denies any fevers, chills, tongue or mouth swelling, difficulty breathing or swallowing. She's had problem with this tooth for some time but is acutely worsened over the past couple of days. Since the pain got bad today, she's noticed she's been having contractions for an hour. She says they are tightening, field 5-10 minutes apart during her lower abdomen. She's not had any rush of fluid, no vaginal bleeding, no chest pain, shortness of breath, lower extremity edema, headaches. Patient does endorse some frequency.    Past Medical History  Diagnosis Date  . Anemia     Past Surgical History  Procedure Laterality Date  . No past surgeries    . Cesarean section      Family History  Problem Relation Age of Onset  . Other Neg Hx   . Hypertension Mother   . Stroke Maternal Grandmother   . Hypertension Maternal Grandmother     History  Substance Use Topics  . Smoking status: Never Smoker   . Smokeless tobacco: Not on file  . Alcohol Use: No    OB History   Grav Para Term Preterm Abortions TAB SAB Ect Mult Living   2 1 1       1       Review of Systems At least 10pt or greater review of systems completed and are negative except where specified in the HPI.  Allergies  Review of patient's allergies indicates no known allergies.  Home Medications   Current Outpatient Rx  Name  Route  Sig  Dispense  Refill  . Prenatal Vit-Fe Fumarate-FA (PRENATAL MULTIVITAMIN) TABS   Oral   Take 1 tablet  by mouth daily at 12 noon.           BP 138/87  Pulse 96  Temp(Src) 98 F (36.7 C) (Oral)  Resp 18  Ht 5\' 3"  (1.6 m)  Wt 93 lb (42.185 kg)  BMI 16.48 kg/m2  SpO2 100%  LMP 05/16/2012  Physical Exam  HENT:  Mouth/Throat:      Nursing notes reviewed.  Electronic medical record reviewed. VITAL SIGNS:   Filed Vitals:   12/16/12 0030 12/16/12 0130 12/16/12 0200 12/16/12 0257  BP: 139/84 138/79 131/75 140/79  Pulse: 84 86 89 116  Temp:    97.9 F (36.6 C)  TempSrc:    Oral  Resp:      Height:      Weight:      SpO2: 100% 100% 100%    CONSTITUTIONAL: Awake, oriented, appears non-toxic HENT: Atraumatic, normocephalic, oral mucosa pink and moist, airway patent. Nares patent without drainage. #1 tooth is carious,  No abscess evidence, no fluctuance in the gingiva or cheeks. External ears normal. EYES: Conjunctiva clear, EOMI, PERRLA NECK: Trachea midline, non-tender, supple CARDIOVASCULAR: Normal heart rate, Normal rhythm, No murmurs, rubs, gallops PULMONARY/CHEST: Clear to auscultation, no rhonchi, wheezes, or rales. Symmetrical breath sounds. Non-tender. ABDOMINAL:  Gravid, soft, non-tender - no rebound or guarding.  BS normal. NEUROLOGIC: Non-focal, moving all four extremities, no gross sensory or motor deficits. EXTREMITIES: No clubbing, cyanosis, or edema SKIN: Warm, Dry, No erythema, No rash   ED Course  Procedures (including critical care time)  Labs Reviewed - No data to display No results found.   1. Premature uterine contractions causing threatened premature labor, third trimester   2. Dental caries   3. Tooth pain   4. UTI (lower urinary tract infection)   5. Braxton Hicks contractions       MDM  Patient presents with contractions, no evidence of focal abscess in her mouth. Give her a local dental block which gave her satisfactory pain relief. Rapid response OB nurse on scene, she performed multiple cervical checks, patient is at 1 cm per RN, did not  recheck her.  Patient has had some increasing frequency of contractions, per Dr. Claiborne Billings her OB/GYN we'll transfer to women[facility] for further monitoring.  Patient does have an evident UTI we'll put her on Keflex for that.        Jones Skene, MD 12/16/12 1008

## 2012-12-16 NOTE — ED Notes (Signed)
CBG checked 75

## 2012-12-16 NOTE — Progress Notes (Signed)
Terb 0.25 mg given in left upper arm.

## 2012-12-16 NOTE — Progress Notes (Signed)
Patient awaiting dental procedure by EDP.

## 2012-12-16 NOTE — ED Notes (Signed)
[facility] at bedside. Transporting patient to [facility] for further monitoring.

## 2012-12-17 LAB — URINE CULTURE: Colony Count: >=100000

## 2013-01-27 ENCOUNTER — Inpatient Hospital Stay (HOSPITAL_COMMUNITY)
Admission: AD | Admit: 2013-01-27 | Discharge: 2013-01-27 | Disposition: A | Discharge status: Home / Self Care | Payer: 59 | Source: Ambulatory Visit | Attending: Obstetrics and Gynecology | Admitting: Obstetrics and Gynecology

## 2013-01-27 ENCOUNTER — Encounter (HOSPITAL_COMMUNITY): Payer: Self-pay | Admitting: *Deleted

## 2013-01-27 DIAGNOSIS — O479 False labor, unspecified: Secondary | ICD-10-CM | POA: Insufficient documentation

## 2013-01-27 MED ORDER — NIFEDIPINE 10 MG PO CAPS
10.0000 mg | ORAL_CAPSULE | ORAL | Status: AC
  Administered 2013-01-27 (×3): 10 mg via ORAL
  Filled 2013-01-27 (×3): qty 1

## 2013-01-27 NOTE — MAU Note (Signed)
Patient states having a lot pressure and contractions since 0430 feels like she has to have bowel movement but doesn't

## 2013-02-07 ENCOUNTER — Inpatient Hospital Stay (HOSPITAL_COMMUNITY): Payer: 59 | Admitting: Anesthesiology

## 2013-02-07 ENCOUNTER — Encounter (HOSPITAL_COMMUNITY): Payer: Self-pay | Admitting: Anesthesiology

## 2013-02-07 ENCOUNTER — Inpatient Hospital Stay (HOSPITAL_COMMUNITY)
Admission: AD | Admit: 2013-02-07 | Discharge: 2013-02-09 | DRG: 775 | Disposition: A | Discharge status: Home / Self Care | Payer: 59 | Source: Ambulatory Visit | Attending: Obstetrics and Gynecology | Admitting: Obstetrics and Gynecology

## 2013-02-07 ENCOUNTER — Encounter (HOSPITAL_COMMUNITY): Payer: Self-pay | Admitting: *Deleted

## 2013-02-07 DIAGNOSIS — O9903 Anemia complicating the puerperium: Secondary | ICD-10-CM | POA: Diagnosis not present

## 2013-02-07 DIAGNOSIS — D6489 Other specified anemias: Secondary | ICD-10-CM | POA: Diagnosis not present

## 2013-02-07 DIAGNOSIS — O34219 Maternal care for unspecified type scar from previous cesarean delivery: Secondary | ICD-10-CM | POA: Diagnosis present

## 2013-02-07 LAB — COMPREHENSIVE METABOLIC PANEL
AST: 26 U/L (ref 0–37)
Albumin: 2.7 g/dL — ABNORMAL LOW (ref 3.5–5.2)
Calcium: 9.2 mg/dL (ref 8.4–10.5)
Chloride: 107 mEq/L (ref 96–112)
Creatinine, Ser: 0.62 mg/dL (ref 0.50–1.10)

## 2013-02-07 LAB — CBC
MCH: 20.6 pg — ABNORMAL LOW (ref 26.0–34.0)
MCV: 68.6 fL — ABNORMAL LOW (ref 78.0–100.0)
Platelets: 275 10*3/uL (ref 150–400)
RDW: 17.2 % — ABNORMAL HIGH (ref 11.5–15.5)
WBC: 15.1 10*3/uL — ABNORMAL HIGH (ref 4.0–10.5)

## 2013-02-07 LAB — TYPE AND SCREEN: Antibody Screen: NEGATIVE

## 2013-02-07 LAB — URIC ACID: Uric Acid, Serum: 4.6 mg/dL (ref 2.4–7.0)

## 2013-02-07 LAB — ABO/RH: ABO/RH(D): O POS

## 2013-02-07 MED ORDER — LANOLIN HYDROUS EX OINT: TOPICAL_OINTMENT | CUTANEOUS | Status: DC | PRN

## 2013-02-07 MED ORDER — METHYLERGONOVINE MALEATE 0.2 MG/ML IJ SOLN: 0.2000 mg | INTRAMUSCULAR | Status: DC | PRN

## 2013-02-07 MED ORDER — TETANUS-DIPHTH-ACELL PERTUSSIS 5-2.5-18.5 LF-MCG/0.5 IM SUSP
0.5000 mL | Freq: Once | INTRAMUSCULAR | Status: AC
  Administered 2013-02-08: 0.5 mL via INTRAMUSCULAR

## 2013-02-07 MED ORDER — SIMETHICONE 80 MG PO CHEW: 80.0000 mg | CHEWABLE_TABLET | ORAL | Status: DC | PRN

## 2013-02-07 MED ORDER — BUTORPHANOL TARTRATE 1 MG/ML IJ SOLN
1.0000 mg | INTRAMUSCULAR | Status: DC | PRN
  Administered 2013-02-07: 1 mg via INTRAVENOUS
  Filled 2013-02-07: qty 1

## 2013-02-07 MED ORDER — CITRIC ACID-SODIUM CITRATE 334-500 MG/5ML PO SOLN: 30.0000 mL | ORAL | Status: DC | PRN

## 2013-02-07 MED ORDER — LACTATED RINGERS IV SOLN
INTRAVENOUS | Status: DC
  Administered 2013-02-07 (×2): via INTRAVENOUS

## 2013-02-07 MED ORDER — LIDOCAINE HCL (PF) 1 % IJ SOLN
INTRAMUSCULAR | Status: DC | PRN
  Administered 2013-02-07 (×4): 4 mL

## 2013-02-07 MED ORDER — DIBUCAINE 1 % RE OINT: 1.0000 "application " | TOPICAL_OINTMENT | RECTAL | Status: DC | PRN

## 2013-02-07 MED ORDER — SENNOSIDES-DOCUSATE SODIUM 8.6-50 MG PO TABS
2.0000 | ORAL_TABLET | Freq: Every day | ORAL | Status: DC
  Administered 2013-02-07 – 2013-02-08 (×2): 2 via ORAL

## 2013-02-07 MED ORDER — IBUPROFEN 600 MG PO TABS
600.0000 mg | ORAL_TABLET | Freq: Four times a day (QID) | ORAL | Status: DC
  Administered 2013-02-07 – 2013-02-09 (×7): 600 mg via ORAL
  Filled 2013-02-07 (×7): qty 1

## 2013-02-07 MED ORDER — PHENYLEPHRINE 40 MCG/ML (10ML) SYRINGE FOR IV PUSH (FOR BLOOD PRESSURE SUPPORT)
80.0000 ug | PREFILLED_SYRINGE | INTRAVENOUS | Status: DC | PRN
  Filled 2013-02-07: qty 2

## 2013-02-07 MED ORDER — OXYTOCIN BOLUS FROM INFUSION: 500.0000 mL | INTRAVENOUS | Status: DC

## 2013-02-07 MED ORDER — ZOLPIDEM TARTRATE 5 MG PO TABS: 5.0000 mg | ORAL_TABLET | Freq: Every evening | ORAL | Status: DC | PRN

## 2013-02-07 MED ORDER — METHYLERGONOVINE MALEATE 0.2 MG PO TABS: 0.2000 mg | ORAL_TABLET | ORAL | Status: DC | PRN

## 2013-02-07 MED ORDER — DIPHENHYDRAMINE HCL 25 MG PO CAPS: 25.0000 mg | ORAL_CAPSULE | Freq: Four times a day (QID) | ORAL | Status: DC | PRN

## 2013-02-07 MED ORDER — ONDANSETRON HCL 4 MG/2ML IJ SOLN: 4.0000 mg | Freq: Four times a day (QID) | INTRAMUSCULAR | Status: DC | PRN

## 2013-02-07 MED ORDER — BENZOCAINE-MENTHOL 20-0.5 % EX AERO: 1.0000 "application " | INHALATION_SPRAY | CUTANEOUS | Status: DC | PRN

## 2013-02-07 MED ORDER — OXYCODONE-ACETAMINOPHEN 5-325 MG PO TABS: 1.0000 | ORAL_TABLET | ORAL | Status: DC | PRN

## 2013-02-07 MED ORDER — WITCH HAZEL-GLYCERIN EX PADS: 1.0000 "application " | MEDICATED_PAD | CUTANEOUS | Status: DC | PRN

## 2013-02-07 MED ORDER — LACTATED RINGERS IV SOLN: 500.0000 mL | INTRAVENOUS | Status: DC | PRN

## 2013-02-07 MED ORDER — ONDANSETRON HCL 4 MG/2ML IJ SOLN: 4.0000 mg | INTRAMUSCULAR | Status: DC | PRN

## 2013-02-07 MED ORDER — OXYCODONE-ACETAMINOPHEN 5-325 MG PO TABS
1.0000 | ORAL_TABLET | ORAL | Status: DC | PRN
  Administered 2013-02-07 – 2013-02-09 (×8): 1 via ORAL
  Filled 2013-02-07 (×8): qty 1

## 2013-02-07 MED ORDER — PHENYLEPHRINE 40 MCG/ML (10ML) SYRINGE FOR IV PUSH (FOR BLOOD PRESSURE SUPPORT)
80.0000 ug | PREFILLED_SYRINGE | INTRAVENOUS | Status: DC | PRN
  Filled 2013-02-07: qty 5
  Filled 2013-02-07: qty 2

## 2013-02-07 MED ORDER — ONDANSETRON HCL 4 MG PO TABS: 4.0000 mg | ORAL_TABLET | ORAL | Status: DC | PRN

## 2013-02-07 MED ORDER — PRENATAL MULTIVITAMIN CH
1.0000 | ORAL_TABLET | Freq: Every day | ORAL | Status: DC
  Administered 2013-02-08: 1 via ORAL
  Filled 2013-02-07: qty 1

## 2013-02-07 MED ORDER — EPHEDRINE 5 MG/ML INJ
10.0000 mg | INTRAVENOUS | Status: DC | PRN
  Filled 2013-02-07: qty 2

## 2013-02-07 MED ORDER — IBUPROFEN 600 MG PO TABS: 600.0000 mg | ORAL_TABLET | Freq: Four times a day (QID) | ORAL | Status: DC | PRN

## 2013-02-07 MED ORDER — ACETAMINOPHEN 325 MG PO TABS: 650.0000 mg | ORAL_TABLET | ORAL | Status: DC | PRN

## 2013-02-07 MED ORDER — DIPHENHYDRAMINE HCL 50 MG/ML IJ SOLN: 12.5000 mg | INTRAMUSCULAR | Status: DC | PRN

## 2013-02-07 MED ORDER — OXYTOCIN 40 UNITS IN LACTATED RINGERS INFUSION - SIMPLE MED
62.5000 mL/h | INTRAVENOUS | Status: DC
  Administered 2013-02-07: 62.5 mL/h via INTRAVENOUS
  Filled 2013-02-07: qty 1000

## 2013-02-07 MED ORDER — LACTATED RINGERS IV SOLN
500.0000 mL | Freq: Once | INTRAVENOUS | Status: AC
  Administered 2013-02-07: 500 mL via INTRAVENOUS

## 2013-02-07 MED ORDER — FENTANYL 2.5 MCG/ML BUPIVACAINE 1/10 % EPIDURAL INFUSION (WH - ANES)
14.0000 mL/h | INTRAMUSCULAR | Status: DC | PRN
  Administered 2013-02-07: 14 mL/h via EPIDURAL
  Filled 2013-02-07: qty 125

## 2013-02-07 MED ORDER — LIDOCAINE HCL (PF) 1 % IJ SOLN
30.0000 mL | INTRAMUSCULAR | Status: AC | PRN
  Administered 2013-02-07: 30 mL via SUBCUTANEOUS
  Filled 2013-02-07 (×2): qty 30

## 2013-02-07 MED ORDER — EPHEDRINE 5 MG/ML INJ
10.0000 mg | INTRAVENOUS | Status: DC | PRN
  Filled 2013-02-07: qty 2
  Filled 2013-02-07: qty 4

## 2013-02-07 NOTE — MAU Provider Note (Signed)
Ms. BREN BORYS is a 27 y.o. G2P1001 at [redacted]w[redacted]d who presents to MAU with contractions. The patient had a few elevated BPs on arrival in MAU. The patient denies headache, blurred vision, RUQ pain or peripheral edema.    BP 139/83  Pulse 79  Temp(Src) 98.4 F (36.9 C) (Oral)  Resp 16  Ht 5\' 3"  (1.6 m)  Wt 204 lb 4 oz (92.647 kg)  BMI 36.19 kg/m2  SpO2 100%  LMP 05/16/2012 GENERAL: Well-developed, well-nourished female in no acute distress.  HEENT: Normocephalic, atraumatic. Sclerae anicteric.  LUNGS: Clear to auscultation bilaterally.  HEART: Regular rate and rhythm. ABDOMEN: Soft, nontender, nondistended. No organomegaly. EXTREMITIES: No cyanosis, clubbing, or edema, 2+ distal pulses. NEURO: 2+ DTRs, no clonus  Patient Vitals for the past 24 hrs:  BP Temp Temp src Pulse Resp SpO2 Height Weight  02/07/13 0657 139/83 mmHg - - 79 - - - -  02/07/13 0655 129/93 mmHg - - 96 - - - -  02/07/13 0647 132/85 mmHg 98.4 F (36.9 C) Oral 90 16 100 % 5\' 3"  (1.6 m) 204 lb 4 oz (92.647 kg)    Results for orders placed during the hospital encounter of 02/07/13 (from the past 24 hour(s))  OB RESULTS CONSOLE GBS     Status: None   Collection Time    02/07/13 12:00 AM      Result Value Range   GBS Negative      Medical screening exam complete  Freddi Starr, PA-C 02/07/2013 8:28 AM

## 2013-02-07 NOTE — Anesthesia Preprocedure Evaluation (Signed)
Anesthesia Evaluation  Patient identified by MRN, date of birth, ID band Patient awake    Reviewed: Allergy & Precautions, H&P , NPO status , Patient's Chart, lab work & pertinent test results, reviewed documented beta blocker date and time   History of Anesthesia Complications Negative for: history of anesthetic complications  Airway Mallampati: II TM Distance: >3 FB Neck ROM: full    Dental  (+) Teeth Intact   Pulmonary neg pulmonary ROS,  breath sounds clear to auscultation        Cardiovascular negative cardio ROS  Rhythm:regular Rate:Normal     Neuro/Psych negative neurological ROS  negative psych ROS   GI/Hepatic negative GI ROS, Neg liver ROS,   Endo/Other  negative endocrine ROSBMI 36.3  Renal/GU negative Renal ROS     Musculoskeletal   Abdominal   Peds  Hematology  (+) Blood dyscrasia (hgb 8.8), anemia ,   Anesthesia Other Findings   Reproductive/Obstetrics (+) Pregnancy (h/o c/s x1 for fetal distress, attempting VBAC)                           Anesthesia Physical Anesthesia Plan  ASA: II  Anesthesia Plan: Epidural   Post-op Pain Management:    Induction:   Airway Management Planned:   Additional Equipment:   Intra-op Plan:   Post-operative Plan:   Informed Consent: I have reviewed the patients History and Physical, chart, labs and discussed the procedure including the risks, benefits and alternatives for the proposed anesthesia with the patient or authorized representative who has indicated his/her understanding and acceptance.     Plan Discussed with:   Anesthesia Plan Comments:         Anesthesia Quick Evaluation

## 2013-02-07 NOTE — Anesthesia Procedure Notes (Signed)
Epidural Patient location during procedure: OB Start time: 02/07/2013 12:46 PM  Staffing Performed by: anesthesiologist   Preanesthetic Checklist Completed: patient identified, site marked, surgical consent, pre-op evaluation, timeout performed, IV checked, risks and benefits discussed and monitors and equipment checked  Epidural Patient position: sitting Prep: site prepped and draped and DuraPrep Patient monitoring: continuous pulse ox and blood pressure Approach: midline Injection technique: LOR air  Needle:  Needle type: Tuohy  Needle gauge: 17 G Needle length: 9 cm and 9 Needle insertion depth: 6 cm Catheter type: closed end flexible Catheter size: 19 Gauge Catheter at skin depth: 11 cm Test dose: negative  Assessment Events: blood not aspirated, injection not painful, no injection resistance, negative IV test and no paresthesia  Additional Notes Discussed risk of headache, infection, bleeding, nerve injury and failed or incomplete block.  Patient voices understanding and wishes to proceed.  Epidural placed easily on first attempt.  No paresthesia.  Copious bleeding from skin puncture site  - pressure held for several minutes before hemostasis achieved.  Patient tolerated procedure well. Jasmine December, MDReason for block:procedure for pain

## 2013-02-07 NOTE — H&P (Signed)
Jean Glass is a 27 y.o. female presenting for labor  27 yo G2P1001 @ 38+1 for labor.  She has a h/o prior cs for NRFWB. She presents for contractiosn and is found to be in labor.  Her cervix is 4-5/90/-2 with bulging memebranes.  She desires a TOL. History OB History   Grav Para Term Preterm Abortions TAB SAB Ect Mult Living   2 1 1       1      Past Medical History  Diagnosis Date  . Anemia    Past Surgical History  Procedure Laterality Date  . No past surgeries    . Cesarean section     Family History: family history includes Hypertension in her maternal grandmother and mother and Stroke in her maternal grandmother.  There is no history of Other. Social History:  reports that she has never smoked. She has never used smokeless tobacco. She reports that she does not drink alcohol or use illicit drugs.   Prenatal Transfer Tool  Maternal Diabetes: No Genetic Screening: Normal Maternal Ultrasounds/Referrals: Normal Fetal Ultrasounds or other Referrals:  None Maternal Substance Abuse:  No Significant Maternal Medications:  None Significant Maternal Lab Results:  None Other Comments:  None  ROS: as above  Dilation: 4.5 Effacement (%): 90 Station: -2 Exam by:: Dr. Tenny Craw Blood pressure 116/78, pulse 84, temperature 98.4 F (36.9 C), temperature source Oral, resp. rate 16, height 5\' 3"  (1.6 m), weight 92.647 kg (204 lb 4 oz), last menstrual period 05/16/2012, SpO2 100.00%. Exam Physical Exam  Prenatal labs: ABO, Rh: --/--/O POS (10/21 2031) Antibody: Negative (01/07 0000) Rubella: Immune (01/07 0000) RPR: Nonreactive (01/07 0000)  HBsAg: Negative (01/07 0000)  HIV: Non-reactive (01/07 0000)  GBS: Negative (06/06 0000)   Assessment/Plan: 1) Admit 2) Epidural on request 3) TOL   Jean Glass H. 02/07/2013, 9:26 AM

## 2013-02-08 LAB — CBC
HCT: 25.6 % — ABNORMAL LOW (ref 36.0–46.0)
Hemoglobin: 7.8 g/dL — ABNORMAL LOW (ref 12.0–15.0)
MCV: 68.6 fL — ABNORMAL LOW (ref 78.0–100.0)
WBC: 16.4 10*3/uL — ABNORMAL HIGH (ref 4.0–10.5)

## 2013-02-08 NOTE — Progress Notes (Signed)
Post Partum Day 1 Subjective: Doing well, bleeding appropriate, breastfeeding  Objective: Blood pressure 114/74, pulse 85, temperature 98.3 F (36.8 C), temperature source Oral, resp. rate 18, height 5\' 3"  (1.6 m), weight 92.647 kg (204 lb 4 oz), last menstrual period 05/16/2012, SpO2 100.00%, unknown if currently breastfeeding.  Physical Exam:  General: AOX3, NAD Lochia: appropropriate Uterine Fundus: firm   Recent Labs  02/07/13 0808 02/08/13 0625  HGB 8.8* 7.8*  HCT 29.3* 25.6*    Assessment/Plan: 1) Routine pp care 2) PP anemia, continue PNV 3) Breastfeeding   LOS: 1 day   Ixchel Duck H. 02/08/2013, 2:38 PM

## 2013-02-08 NOTE — Anesthesia Postprocedure Evaluation (Signed)
  Anesthesia Post-op Note  Patient: Jean Glass  Procedure(s) Performed: * No procedures listed *  Patient Location: Mother/Baby  Anesthesia Type:Epidural  Level of Consciousness: awake and alert   Airway and Oxygen Therapy: Patient Spontanous Breathing  Post-op Pain: mild  Post-op Assessment: Patient's Cardiovascular Status Stable, Respiratory Function Stable, No signs of Nausea or vomiting, Pain level controlled and No headache  Post-op Vital Signs: Reviewed and stable  Complications: No apparent anesthesia complications

## 2013-02-09 MED ORDER — OXYCODONE-ACETAMINOPHEN 5-325 MG PO TABS: 2.0000 | ORAL_TABLET | ORAL | Status: DC | PRN

## 2013-02-09 MED ORDER — IBUPROFEN 600 MG PO TABS: 600.0000 mg | ORAL_TABLET | Freq: Four times a day (QID) | ORAL | Status: DC | PRN

## 2013-02-09 MED ORDER — DOCUSATE SODIUM 100 MG PO CAPS: 100.0000 mg | ORAL_CAPSULE | Freq: Two times a day (BID) | ORAL | Status: DC

## 2013-02-09 NOTE — Discharge Summary (Signed)
Obstetric Discharge Summary Reason for Admission: onset of labor Prenatal Procedures: ultrasound Intrapartum Procedures: spontaneous vaginal delivery Postpartum Procedures: none Complications-Operative and Postpartum: labial laceration Hemoglobin  Date Value Range Status  02/08/2013 7.8* 12.0 - 15.0 g/dL Final     HCT  Date Value Range Status  02/08/2013 25.6* 36.0 - 46.0 % Final    Physical Exam:  General: alert, cooperative and appears stated age 27: appropriate Uterine Fundus: firm Incision: healing well DVT Evaluation: No evidence of DVT seen on physical exam.  Discharge Diagnoses: Term Pregnancy-delivered, anemia  Discharge Information: Date: 02/09/2013 Activity: pelvic rest Diet: routine Medications: Ibuprofen, Colace and Percocet Condition: improved Instructions: refer to practice specific booklet Discharge to: home Follow-up Information   Follow up with Almon Hercules., MD In 4 weeks. (For a postpartum evaluation)    Contact information:   [address] [place] [address] [phone]       Newborn Data: Live born female  Birth Weight: 6 lb 2.6 oz (2795 g) APGAR: 9, 9  Home with mother.  Deja Kaigler H. 02/09/2013, 10:20 AM

## 2013-07-10 ENCOUNTER — Other Ambulatory Visit: Payer: Self-pay

## 2013-10-08 ENCOUNTER — Encounter: Payer: Self-pay | Admitting: Diagnostic Neuroimaging

## 2013-10-08 ENCOUNTER — Ambulatory Visit (INDEPENDENT_AMBULATORY_CARE_PROVIDER_SITE_OTHER): Payer: 59 | Admitting: Diagnostic Neuroimaging

## 2013-10-08 VITALS — BP 118/78 | HR 91 | Temp 98.2°F | Ht 63.0 in | Wt 196.0 lb

## 2013-10-08 DIAGNOSIS — G43009 Migraine without aura, not intractable, without status migrainosus: Secondary | ICD-10-CM

## 2013-10-08 MED ORDER — TOPIRAMATE 50 MG PO TABS: 50.0000 mg | ORAL_TABLET | Freq: Two times a day (BID) | ORAL | Status: DC

## 2013-10-08 MED ORDER — SUMATRIPTAN SUCCINATE 100 MG PO TABS: 100.0000 mg | ORAL_TABLET | Freq: Once | ORAL | Status: DC | PRN

## 2013-10-08 NOTE — Patient Instructions (Signed)
Start topiramate 50 mg at bedtime for 2 weeks. Then increase to twice a day. Drink plenty of water with this medication.  Used sumatriptan as needed for breakthrough migraine. May repeat after one to 2 hours.  Start headache diary, and track severity, gait, food intake, hormone cycle, stress, sleep factors.

## 2013-10-08 NOTE — Progress Notes (Signed)
[facility]  PATIENT: Jean Glass DOB: Mar 04, 1986  REFERRING CLINICIAN: Claiborne Billingsallahan HISTORY FROM: patient REASON FOR VISIT: new consult   HISTORICAL  CHIEF COMPLAINT:  Chief Complaint  Patient presents with  . Migraine    HISTORY OF PRESENT ILLNESS:   28 year old right-handed female here for evaluation of migraine.  Patient had first headaches in high school, consisting of left-sided throbbing severe headaches with photophobia, phonophobia and nausea. Patient was having one headache per week, well-controlled with ibuprofen.  Headaches are stable until March 2014, when patient was in the third trimester of her pregnancy. Headaches were of similar type but more severe. Now patient continues to have headaches, occurring almost every other day. No specific triggering factors. Patient tried Fioricet without relief.  No family history of migraine. Patient has not had MRI brain in the past. Patient has not tried any other migraine medications in the past.  REVIEW OF SYSTEMS: Full 14 system review of systems performed and notable only for headache weakness dizziness anemia fatigue.   ALLERGIES: No Known Allergies  HOME MEDICATIONS: Outpatient Prescriptions Prior to Visit  Medication Sig Dispense Refill  . docusate sodium (COLACE) 100 MG capsule Take 1 capsule (100 mg total) by mouth 2 (two) times daily.  60 capsule  0  . ibuprofen (ADVIL,MOTRIN) 600 MG tablet Take 1 tablet (600 mg total) by mouth every 6 (six) hours as needed for pain.  90 tablet  0  . oxyCODONE-acetaminophen (ROXICET) 5-325 MG per tablet Take 2 tablets by mouth every 4 (four) hours as needed for pain. May take 1-2 tablets every 4-6 hours as needed for pain  30 tablet  0  . Prenatal Vit-Fe Fumarate-FA (PRENATAL MULTIVITAMIN) TABS Take 1 tablet by mouth daily at 12 noon.       No facility-administered medications prior to visit.    PAST MEDICAL HISTORY: Past Medical History  Diagnosis  Date  . Anemia   . Migraine     PAST SURGICAL HISTORY: Past Surgical History  Procedure Laterality Date  . No past surgeries    . Cesarean section      FAMILY HISTORY: Family History  Problem Relation Age of Onset  . Other Neg Hx   . Hypertension Mother   . Stroke Maternal Grandmother   . Hypertension Maternal Grandmother     SOCIAL HISTORY:  History   Social History  . Marital Status: Single    Spouse Name: N/A    Number of Children: 2  . Years of Education: College   Occupational History  .  [employer]   Social History Main Topics  . Smoking status: Never Smoker   . Smokeless tobacco: Never Used  . Alcohol Use: No  . Drug Use: No  . Sexual Activity: Yes   Other Topics Concern  . Not on file   Social History Narrative   Patient lives at home with children.   Caffeine Use: 1-2 cups daily (sodas/tea)     PHYSICAL EXAM  Filed Vitals:   10/08/13 0940  BP: 118/78  Pulse: 91  Temp: 98.2 F (36.8 C)  TempSrc: Oral  Height: 5\' 3"  (1.6 m)  Weight: 196 lb (88.905 kg)    Not recorded    Body mass index is 34.73 kg/(m^2).  GENERAL EXAM: Patient is in no distress; well developed, nourished and groomed; neck is supple; COLORED CONTACT LENSES.  CARDIOVASCULAR: Regular rate and rhythm, no murmurs, no carotid bruits  NEUROLOGIC: MENTAL STATUS: awake, alert, oriented to person,  place and time, recent and remote memory intact, normal attention and concentration, language fluent, comprehension intact, naming intact, fund of knowledge appropriate CRANIAL NERVE: no papilledema on fundoscopic exam, pupils equal and reactive to light, visual fields full to confrontation, extraocular muscles intact, no nystagmus, facial sensation and strength symmetric, hearing intact, palate elevates symmetrically, uvula midline, shoulder shrug symmetric, tongue midline. MOTOR: normal bulk and tone, full strength in the BUE, BLE SENSORY: normal and symmetric to light touch,  pinprick, temperature, vibration COORDINATION: finger-nose-finger, fine finger movements normal REFLEXES: deep tendon reflexes present and symmetric GAIT/STATION: narrow based gait; able to walk tandem; romberg is negative    DIAGNOSTIC DATA (LABS, IMAGING, TESTING) - I reviewed patient records, labs, notes, testing and imaging myself where available.  Lab Results  Component Value Date   WBC 16.4* 02/08/2013   HGB 7.8* 02/08/2013   HCT 25.6* 02/08/2013   MCV 68.6* 02/08/2013   PLT 242 02/08/2013      Component Value Date/Time   NA 137 02/07/2013 0808   K 3.9 02/07/2013 0808   CL 107 02/07/2013 0808   CO2 21 02/07/2013 0808   GLUCOSE 81 02/07/2013 0808   BUN 5* 02/07/2013 0808   CREATININE 0.62 02/07/2013 0808   CALCIUM 9.2 02/07/2013 0808   PROT 6.5 02/07/2013 0808   ALBUMIN 2.7* 02/07/2013 0808   AST 26 02/07/2013 0808   ALT 29 02/07/2013 0808   ALKPHOS 180* 02/07/2013 0808   BILITOT 0.2* 02/07/2013 0808   GFRNONAA >90 02/07/2013 0808   GFRAA >90 02/07/2013 0808   Lab Results  Component Value Date   CHOL 199 03/10/2010   HDL 55 03/10/2010   LDLCALC 161* 03/10/2010   TRIG 68 03/10/2010   CHOLHDL 3.6 Ratio 03/10/2010   No results found for this basename: HGBA1C   No results found for this basename: VITAMINB12   No results found for this basename: TSH      ASSESSMENT AND PLAN  28 y.o. year old female here with  migraine headaches without aura. Worse since March 2014. Headaches first began in high school. Neurologic examination is unremarkable.  PLAN: - TPX + sumatriptan prn - headache diary - work forms filled out  Meds ordered this encounter  Medications  . DISCONTD: Butalbital-APAP-Caffeine 50-300-40 MG CAPS    Sig: Take 1 tablet by mouth 2 (two) times daily as needed.  . topiramate (TOPAMAX) 50 MG tablet    Sig: Take 1 tablet (50 mg total) by mouth 2 (two) times daily.    Dispense:  60 tablet    Refill:  12  . SUMAtriptan (IMITREX) 100 MG tablet    Sig: Take 1 tablet (100 mg total) by mouth once  as needed for migraine. May repeat x 1 after 2 hours; maximum 2 tabs per day and 8 tabs per month    Dispense:  8 tablet    Refill:  6    Return in about 4 months (around 02/05/2014) for with Heide Guile or Penumalli.    Suanne Marker, MD 10/08/2013, 10:20 AM Certified in Neurology, Neurophysiology and Neuroimaging  [facility] [address] [place], [place] [address] [phone]

## 2013-11-06 ENCOUNTER — Telehealth: Payer: Self-pay | Admitting: *Deleted

## 2013-11-06 ENCOUNTER — Telehealth: Payer: Self-pay | Admitting: Diagnostic Neuroimaging

## 2013-11-06 NOTE — Telephone Encounter (Signed)
Patient called stating that the medication is not working and wanted to know if Dr. Marjory LiesPenumalli could prescribe something else. Please advise

## 2013-11-06 NOTE — Telephone Encounter (Signed)
Would ask patient to give medications 1 more month to see if it helps. If she cannot wait that long, then setup follow up with me or Larita FifeLynn in next 1-2 weeks. -VRP

## 2013-11-06 NOTE — Telephone Encounter (Signed)
Pt called and stated that the medicine prescribed by Dr. Marjory LiesPenumalli on her visit does not seem to be working. She asked if Dr. Marjory LiesPenumalli could suggest something else she could try.  Please call.  Thank you

## 2013-11-06 NOTE — Telephone Encounter (Signed)
I called pt yesterday and asked about her disability form.  [employer] asking for more information.   I had them fax to me their copy and was the same as mine.  (not all information / cut off  From fax).  I called pt and let her know that I needed a copy of the form she had, which was not cut off.  She was not at work but would fax to [phone] tomorrow.  I had not received today 11-06-13 by 1200, I called and LMVM for her at work # to fax to me at [phone] if needed to proceed.

## 2013-11-07 NOTE — Telephone Encounter (Signed)
Called patient to inform her that Dr. Marjory LiesPenumalli would like for her to take the medication at least 1 more month to see if it helps and if not to call back to set up an appt to come in, per Dr. Marjory LiesPenumalli.

## 2013-11-20 NOTE — Telephone Encounter (Signed)
I called and spoke to Jean Glass in Claims re" pts form.  Since copies of the original form being faxed to me are cut off, I called for another blank form, filled out and then refaxed to [employer].  Hopefully both old and new one will have all the information they need.

## 2014-02-06 ENCOUNTER — Ambulatory Visit: Payer: 59 | Admitting: Nurse Practitioner

## 2014-06-19 ENCOUNTER — Other Ambulatory Visit: Payer: Self-pay

## 2014-07-06 ENCOUNTER — Encounter: Payer: Self-pay | Admitting: Diagnostic Neuroimaging

## 2014-09-13 ENCOUNTER — Emergency Department (HOSPITAL_COMMUNITY)
Admission: EM | Admit: 2014-09-13 | Discharge: 2014-09-13 | Disposition: A | Discharge status: Home / Self Care | Payer: 59 | Attending: Emergency Medicine | Admitting: Emergency Medicine

## 2014-09-13 ENCOUNTER — Encounter (HOSPITAL_COMMUNITY): Payer: Self-pay | Admitting: *Deleted

## 2014-09-13 ENCOUNTER — Emergency Department (HOSPITAL_COMMUNITY): Payer: 59

## 2014-09-13 DIAGNOSIS — G43909 Migraine, unspecified, not intractable, without status migrainosus: Secondary | ICD-10-CM | POA: Diagnosis not present

## 2014-09-13 DIAGNOSIS — Y9389 Activity, other specified: Secondary | ICD-10-CM | POA: Diagnosis not present

## 2014-09-13 DIAGNOSIS — Y92410 Unspecified street and highway as the place of occurrence of the external cause: Secondary | ICD-10-CM | POA: Insufficient documentation

## 2014-09-13 DIAGNOSIS — Y998 Other external cause status: Secondary | ICD-10-CM | POA: Diagnosis not present

## 2014-09-13 DIAGNOSIS — S199XXA Unspecified injury of neck, initial encounter: Secondary | ICD-10-CM | POA: Diagnosis not present

## 2014-09-13 DIAGNOSIS — S3992XA Unspecified injury of lower back, initial encounter: Secondary | ICD-10-CM | POA: Insufficient documentation

## 2014-09-13 DIAGNOSIS — M549 Dorsalgia, unspecified: Secondary | ICD-10-CM

## 2014-09-13 DIAGNOSIS — Z3202 Encounter for pregnancy test, result negative: Secondary | ICD-10-CM | POA: Insufficient documentation

## 2014-09-13 DIAGNOSIS — Z79899 Other long term (current) drug therapy: Secondary | ICD-10-CM | POA: Insufficient documentation

## 2014-09-13 DIAGNOSIS — S299XXA Unspecified injury of thorax, initial encounter: Secondary | ICD-10-CM | POA: Diagnosis not present

## 2014-09-13 DIAGNOSIS — Z862 Personal history of diseases of the blood and blood-forming organs and certain disorders involving the immune mechanism: Secondary | ICD-10-CM | POA: Insufficient documentation

## 2014-09-13 DIAGNOSIS — R52 Pain, unspecified: Secondary | ICD-10-CM

## 2014-09-13 LAB — BASIC METABOLIC PANEL
Anion gap: 12 (ref 5–15)
BUN: 13 mg/dL (ref 6–23)
CALCIUM: 9.5 mg/dL (ref 8.4–10.5)
CHLORIDE: 105 meq/L (ref 96–112)
CO2: 22 mmol/L (ref 19–32)
CREATININE: 0.64 mg/dL (ref 0.50–1.10)
GFR calc non Af Amer: >90 mL/min (ref >90)
GLUCOSE: 82 mg/dL (ref 70–99)
Potassium: 3.7 mmol/L (ref 3.5–5.1)
Sodium: 139 mmol/L (ref 135–145)

## 2014-09-13 LAB — I-STAT TROPONIN, ED: Troponin i, poc: 0 ng/mL (ref 0.00–0.08)

## 2014-09-13 LAB — CBC
HCT: 37.4 % (ref 36.0–46.0)
HEMOGLOBIN: 11.6 g/dL — AB (ref 12.0–15.0)
MCH: 23.9 pg — ABNORMAL LOW (ref 26.0–34.0)
MCHC: 31 g/dL (ref 30.0–36.0)
MCV: 77.1 fL — AB (ref 78.0–100.0)
PLATELETS: 287 10*3/uL (ref 150–400)
RBC: 4.85 MIL/uL (ref 3.87–5.11)
RDW: 16 % — ABNORMAL HIGH (ref 11.5–15.5)
WBC: 10.5 10*3/uL (ref 4.0–10.5)

## 2014-09-13 LAB — PREGNANCY, URINE: PREG TEST UR: NEGATIVE

## 2014-09-13 LAB — POC URINE PREG, ED: Preg Test, Ur: NEGATIVE

## 2014-09-13 MED ORDER — CYCLOBENZAPRINE HCL 10 MG PO TABS
10.0000 mg | ORAL_TABLET | Freq: Once | ORAL | Status: AC
  Administered 2014-09-13: 10 mg via ORAL
  Filled 2014-09-13: qty 1

## 2014-09-13 MED ORDER — OXYCODONE-ACETAMINOPHEN 5-325 MG PO TABS: 1.0000 | ORAL_TABLET | ORAL | Status: DC | PRN

## 2014-09-13 MED ORDER — OXYCODONE-ACETAMINOPHEN 5-325 MG PO TABS
2.0000 | ORAL_TABLET | Freq: Once | ORAL | Status: AC
  Administered 2014-09-13: 2 via ORAL
  Filled 2014-09-13: qty 2

## 2014-09-13 MED ORDER — CYCLOBENZAPRINE HCL 10 MG PO TABS: 10.0000 mg | ORAL_TABLET | Freq: Two times a day (BID) | ORAL | Status: DC | PRN

## 2014-09-13 NOTE — Discharge Instructions (Signed)
Take percocet as needed for pain. Take flexeril as needed for muscle spasm. You may take these medications together. Refer to attached documents for more information. Return to the ED with worsening or concerning symptoms.
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
## 2014-09-15 ENCOUNTER — Emergency Department (HOSPITAL_COMMUNITY)
Admission: EM | Admit: 2014-09-15 | Discharge: 2014-09-15 | Disposition: A | Discharge status: Home / Self Care | Payer: 59 | Attending: Emergency Medicine | Admitting: Emergency Medicine

## 2014-09-15 ENCOUNTER — Encounter (HOSPITAL_COMMUNITY): Payer: Self-pay

## 2014-09-15 ENCOUNTER — Emergency Department (HOSPITAL_COMMUNITY): Payer: 59

## 2014-09-15 DIAGNOSIS — Z3202 Encounter for pregnancy test, result negative: Secondary | ICD-10-CM | POA: Diagnosis not present

## 2014-09-15 DIAGNOSIS — S4992XA Unspecified injury of left shoulder and upper arm, initial encounter: Secondary | ICD-10-CM | POA: Diagnosis not present

## 2014-09-15 DIAGNOSIS — S3992XA Unspecified injury of lower back, initial encounter: Secondary | ICD-10-CM | POA: Insufficient documentation

## 2014-09-15 DIAGNOSIS — G43909 Migraine, unspecified, not intractable, without status migrainosus: Secondary | ICD-10-CM | POA: Insufficient documentation

## 2014-09-15 DIAGNOSIS — S0990XA Unspecified injury of head, initial encounter: Secondary | ICD-10-CM | POA: Insufficient documentation

## 2014-09-15 DIAGNOSIS — Y9389 Activity, other specified: Secondary | ICD-10-CM | POA: Insufficient documentation

## 2014-09-15 DIAGNOSIS — Z79899 Other long term (current) drug therapy: Secondary | ICD-10-CM | POA: Insufficient documentation

## 2014-09-15 DIAGNOSIS — M545 Low back pain: Secondary | ICD-10-CM

## 2014-09-15 DIAGNOSIS — S24109A Unspecified injury at unspecified level of thoracic spinal cord, initial encounter: Secondary | ICD-10-CM | POA: Insufficient documentation

## 2014-09-15 DIAGNOSIS — M25512 Pain in left shoulder: Secondary | ICD-10-CM

## 2014-09-15 DIAGNOSIS — Y998 Other external cause status: Secondary | ICD-10-CM | POA: Diagnosis not present

## 2014-09-15 DIAGNOSIS — S59902A Unspecified injury of left elbow, initial encounter: Secondary | ICD-10-CM | POA: Diagnosis not present

## 2014-09-15 DIAGNOSIS — Y92410 Unspecified street and highway as the place of occurrence of the external cause: Secondary | ICD-10-CM | POA: Diagnosis not present

## 2014-09-15 DIAGNOSIS — R42 Dizziness and giddiness: Secondary | ICD-10-CM | POA: Insufficient documentation

## 2014-09-15 DIAGNOSIS — Z862 Personal history of diseases of the blood and blood-forming organs and certain disorders involving the immune mechanism: Secondary | ICD-10-CM | POA: Insufficient documentation

## 2014-09-15 LAB — POC URINE PREG, ED: Preg Test, Ur: NEGATIVE

## 2014-09-15 NOTE — ED Notes (Signed)
Chaperoned for rectal exam performed by PA.
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
## 2014-09-15 NOTE — Discharge Instructions (Signed)
Please call your doctor for a followup appointment within 24-48 hours. When you talk to your doctor please let them know that you were seen in the emergency department and have them acquire all of your records so that they can discuss the findings with you and formulate a treatment plan to fully care for your new and ongoing problems. Please follow-up with your primary care provider to be seen and assessed this week - may need a MRI of the cervical spine later on Please follow-up with orthopedics, Dr. Carola Frost Please rest and stay hydrated Please avoid any physical strenuous activity Please apply warm compressions and massage with icy hot ointment Please keep left arm in motion to prevent worsening of frozen shoulder Please continue to take medications as prescribed Please continue to monitor symptoms closely and if symptoms are to worsen or change (fever greater than 101, chills, sweating, nausea, vomiting, chest pain, shortness of breathe, difficulty breathing, weakness, numbness, tingling, worsening or changes to pain pattern, swelling, redness, warmth or becomes numb, loss of sensation, fall, injury, visual changes, painting, dizziness) please report back to the Emergency Department immediately.   Shoulder Pain The shoulder is the joint that connects your arms to your body. The bones that form the shoulder joint include the upper arm bone (humerus), the shoulder blade (scapula), and the collarbone (clavicle). The top of the humerus is shaped like a ball and fits into a rather flat socket on the scapula (glenoid cavity). A combination of muscles and strong, fibrous tissues that connect muscles to bones (tendons) support your shoulder joint and hold the ball in the socket. Small, fluid-filled sacs (bursae) are located in different areas of the joint. They act as cushions between the bones and the overlying soft tissues and help reduce friction between the gliding tendons and the bone as you move your arm.  Your shoulder joint allows a wide range of motion in your arm. This range of motion allows you to do things like scratch your back or throw a ball. However, this range of motion also makes your shoulder more prone to pain from overuse and injury. Causes of shoulder pain can originate from both injury and overuse and usually can be grouped in the following four categories:  Redness, swelling, and pain (inflammation) of the tendon (tendinitis) or the bursae (bursitis).  Instability, such as a dislocation of the joint.  Inflammation of the joint (arthritis).  Broken bone (fracture). HOME CARE INSTRUCTIONS   Apply ice to the sore area.  Put ice in a plastic bag.  Place a towel between your skin and the bag.  Leave the ice on for 15-20 minutes, 3-4 times per day for the first 2 days, or as directed by your health care provider.  Stop using cold packs if they do not help with the pain.  If you have a shoulder sling or immobilizer, wear it as long as your caregiver instructs. Only remove it to shower or bathe. Move your arm as little as possible, but keep your hand moving to prevent swelling.  Squeeze a soft ball or foam pad as much as possible to help prevent swelling.  Only take over-the-counter or prescription medicines for pain, discomfort, or fever as directed by your caregiver. SEEK MEDICAL CARE IF:   Your shoulder pain increases, or new pain develops in your arm, hand, or fingers.  Your hand or fingers become cold and numb.  Your pain is not relieved with medicines. SEEK IMMEDIATE MEDICAL CARE IF:   Your  arm, hand, or fingers are numb or tingling.  Your arm, hand, or fingers are significantly swollen or turn white or blue. MAKE SURE YOU:   Understand these instructions.  Will watch your condition.  Will get help right away if you are not doing well or get worse. Document Released: 05/31/2005 Document Revised: 01/05/2014 Document Reviewed: 08/05/2011 [facility] Patient  Information 2015 [place], [place]. This information is not intended to replace advice given to you by your health care provider. Make sure you discuss any questions you have with your health care provider.  Back Pain, Adult Back pain is very common. The pain often gets better over time. The cause of back pain is usually not dangerous. Most people can learn to manage their back pain on their own.  HOME CARE   Stay active. Start with short walks on flat ground if you can. Try to walk farther each day.  Do not sit, drive, or stand in one place for more than 30 minutes. Do not stay in bed.  Do not avoid exercise or work. Activity can help your back heal faster.  Be careful when you bend or lift an object. Bend at your knees, keep the object close to you, and do not twist.  Sleep on a firm mattress. Lie on your side, and bend your knees. If you lie on your back, put a pillow under your knees.  Only take medicines as told by your doctor.  Put ice on the injured area.  Put ice in a plastic bag.  Place a towel between your skin and the bag.  Leave the ice on for 15-20 minutes, 03-04 times a day for the first 2 to 3 days. After that, you can switch between ice and heat packs.  Ask your doctor about back exercises or massage.  Avoid feeling anxious or stressed. Find good ways to deal with stress, such as exercise. GET HELP RIGHT AWAY IF:   Your pain does not go away with rest or medicine.  Your pain does not go away in 1 week.  You have new problems.  You do not feel well.  The pain spreads into your legs.  You cannot control when you poop (bowel movement) or pee (urinate).  Your arms or legs feel weak or lose feeling (numbness).  You feel sick to your stomach (nauseous) or throw up (vomit).  You have belly (abdominal) pain.  You feel like you may pass out (faint). MAKE SURE YOU:   Understand these instructions.  Will watch your condition.  Will get help right away if you  are not doing well or get worse. Document Released: 02/07/2008 Document Revised: 11/13/2011 Document Reviewed: 12/23/2013 [facility] Patient Information 2015 [place], [place]. This information is not intended to replace advice given to you by your health care provider. Make sure you discuss any questions you have with your health care provider. Muscle Pain Muscle pain (myalgia) may be caused by many things, including:  Overuse or muscle strain, especially if you are not in shape. This is the most common cause of muscle pain.  Injury.  Bruises.  Viruses, such as the flu.  Infectious diseases.  Fibromyalgia, which is a chronic condition that causes muscle tenderness, fatigue, and headache.  Autoimmune diseases, including lupus.  Certain drugs, including ACE inhibitors and statins. Muscle pain may be mild or severe. In most cases, the pain lasts only a short time and goes away without treatment. To diagnose the cause of your muscle pain, your health care  provider will take your medical history. This means he or she will ask you when your muscle pain began and what has been happening. If you have not had muscle pain for very long, your health care provider may want to wait before doing much testing. If your muscle pain has lasted a long time, your health care provider may want to run tests right away. If your health care provider thinks your muscle pain may be caused by illness, you may need to have additional tests to rule out certain conditions.  Treatment for muscle pain depends on the cause. Home care is often enough to relieve muscle pain. Your health care provider may also prescribe anti-inflammatory medicine. HOME CARE INSTRUCTIONS Watch your condition for any changes. The following actions may help to lessen any discomfort you are feeling:  Only take over-the-counter or prescription medicines as directed by your health care provider.  Apply ice to the sore muscle:  Put ice in a plastic  bag.  Place a towel between your skin and the bag.  Leave the ice on for 15-20 minutes, 3-4 times a day.  You may alternate applying hot and cold packs to the muscle as directed by your health care provider.  If overuse is causing your muscle pain, slow down your activities until the pain goes away.  Remember that it is normal to feel some muscle pain after starting a workout program. Muscles that have not been used often will be sore at first.  Do regular, gentle exercises if you are not usually active.  Warm up before exercising to lower your risk of muscle pain.  Do not continue working out if the pain is very bad. Bad pain could mean you have injured a muscle. SEEK MEDICAL CARE IF:  Your muscle pain gets worse, and medicines do not help.  You have muscle pain that lasts longer than 3 days.  You have a rash or fever along with muscle pain.  You have muscle pain after a tick bite.  You have muscle pain while working out, even though you are in good physical condition.  You have redness, soreness, or swelling along with muscle pain.  You have muscle pain after starting a new medicine or changing the dose of a medicine. SEEK IMMEDIATE MEDICAL CARE IF:  You have trouble breathing.  You have trouble swallowing.  You have muscle pain along with a stiff neck, fever, and vomiting.  You have severe muscle weakness or cannot move part of your body. MAKE SURE YOU:   Understand these instructions.  Will watch your condition.  Will get help right away if you are not doing well or get worse. Document Released: 07/13/2006 Document Revised: 08/26/2013 Document Reviewed: 06/17/2013 [facility] Patient Information 2015 [place], [place]. This information is not intended to replace advice given to you by your health care provider. Make sure you discuss any questions you have with your health care provider.

## 2016-02-07 ENCOUNTER — Encounter (HOSPITAL_COMMUNITY): Payer: Self-pay | Admitting: Emergency Medicine

## 2016-02-07 ENCOUNTER — Emergency Department (HOSPITAL_COMMUNITY)
Admission: EM | Admit: 2016-02-07 | Discharge: 2016-02-07 | Disposition: A | Discharge status: Home / Self Care | Payer: No Typology Code available for payment source | Attending: Emergency Medicine | Admitting: Emergency Medicine

## 2016-02-07 DIAGNOSIS — K002 Abnormalities of size and form of teeth: Secondary | ICD-10-CM | POA: Insufficient documentation

## 2016-02-07 DIAGNOSIS — Z862 Personal history of diseases of the blood and blood-forming organs and certain disorders involving the immune mechanism: Secondary | ICD-10-CM | POA: Insufficient documentation

## 2016-02-07 DIAGNOSIS — K0889 Other specified disorders of teeth and supporting structures: Secondary | ICD-10-CM | POA: Insufficient documentation

## 2016-02-07 DIAGNOSIS — K029 Dental caries, unspecified: Secondary | ICD-10-CM | POA: Insufficient documentation

## 2016-02-07 DIAGNOSIS — R519 Headache, unspecified: Secondary | ICD-10-CM

## 2016-02-07 DIAGNOSIS — H53149 Visual discomfort, unspecified: Secondary | ICD-10-CM | POA: Insufficient documentation

## 2016-02-07 DIAGNOSIS — R51 Headache: Secondary | ICD-10-CM | POA: Insufficient documentation

## 2016-02-07 DIAGNOSIS — Z79899 Other long term (current) drug therapy: Secondary | ICD-10-CM | POA: Insufficient documentation

## 2016-02-07 MED ORDER — TRAMADOL HCL 50 MG PO TABS: 50.0000 mg | ORAL_TABLET | Freq: Four times a day (QID) | ORAL | Status: DC | PRN

## 2016-02-07 MED ORDER — KETOROLAC TROMETHAMINE 60 MG/2ML IM SOLN
60.0000 mg | Freq: Once | INTRAMUSCULAR | Status: AC
  Administered 2016-02-07: 60 mg via INTRAMUSCULAR
  Filled 2016-02-07: qty 2

## 2016-02-07 MED ORDER — NAPROXEN 500 MG PO TABS: 500.0000 mg | ORAL_TABLET | Freq: Two times a day (BID) | ORAL | Status: DC

## 2016-02-07 NOTE — ED Provider Notes (Signed)
CSN: [id number]     Arrival date & time 02/07/16  1318 History  By signing my name below, I, Jean Glass, attest that this documentation has been prepared under the direction and in the presence of Jean CriglerJoshua Neomia Herbel, PA-C.   Electronically Signed: Hollace HaywardAndrew Glass, ED Scribe. 02/07/2016. 2:55 PM.   Chief Complaint  Patient presents with  . Dental Pain   HPI HPI Comments: Jean Glass is a 30 y.o. female with a PMHx significant of Migraines and Anemia who presents to the Emergency Department complaining of gradual onset, gradually worsening, throbbing, frontal L-sided headache that began PTA today. Pt has associated phonophobia and photophobia. Pt has associated constant upper, L-sided dental pain onset 2 days ago. She has a hx of HA that she states feels similar to her HA today. Pt reports that she took Aleve 220mg  x 2 at 12pm (approximately 3 hours ago) with no relief. She states that she is ambulating well. Pt denies known head injury, facial swelling, neck swelling, or emesis.  Past Medical History  Diagnosis Date  . Anemia   . Migraine    Past Surgical History  Procedure Laterality Date  . No past surgeries    . Cesarean section     Family History  Problem Relation Age of Onset  . Other Neg Hx   . Hypertension Mother   . Stroke Maternal Grandmother   . Hypertension Maternal Grandmother    Social History  Substance Use Topics  . Smoking status: Never Smoker   . Smokeless tobacco: Never Used  . Alcohol Use: No   OB History    Gravida Para Term Preterm AB TAB SAB Ectopic Multiple Living   2 2 2       2      Review of Systems  Constitutional: Negative for fever.  HENT: Positive for dental problem. Negative for congestion, ear pain, facial swelling, rhinorrhea, sinus pressure, sore throat and trouble swallowing.        + phonophobia  Eyes: Positive for photophobia. Negative for discharge, redness and visual disturbance.  Respiratory: Negative for shortness of breath and  stridor.   Cardiovascular: Negative for chest pain.  Gastrointestinal: Negative for nausea and vomiting.  Musculoskeletal: Negative for gait problem, neck pain and neck stiffness.  Skin: Negative for color change and rash.  Neurological: Positive for headaches. Negative for syncope, speech difficulty, weakness, light-headedness and numbness.  Psychiatric/Behavioral: Negative for confusion.    Allergies  Review of patient's allergies indicates no known allergies.  Home Medications   Prior to Admission medications   Medication Sig Start Date End Date Taking? Authorizing Provider  naproxen (NAPROSYN) 500 MG tablet Take 1 tablet (500 mg total) by mouth 2 (two) times daily. 02/07/16   Jean CriglerJoshua Johnnisha Forton, PA-C  SUMAtriptan (IMITREX) 100 MG tablet Take 1 tablet (100 mg total) by mouth once as needed for migraine. May repeat x 1 after 2 hours; maximum 2 tabs per day and 8 tabs per month 10/08/13   Suanne MarkerVikram R Penumalli, MD  topiramate (TOPAMAX) 50 MG tablet Take 1 tablet (50 mg total) by mouth 2 (two) times daily. 10/08/13   Suanne MarkerVikram R Penumalli, MD  traMADol (ULTRAM) 50 MG tablet Take 1 tablet (50 mg total) by mouth every 6 (six) hours as needed. 02/07/16   Jean CriglerJoshua Torrell Krutz, PA-C   BP 130/69 mmHg  Pulse 84  Temp(Src) 97.9 F (36.6 C) (Oral)  Resp 20  SpO2 100%   Physical Exam  Constitutional: She is oriented to person, place, and  time. She appears well-developed and well-nourished.  HENT:  Head: Normocephalic and atraumatic.  Right Ear: Tympanic membrane, external ear and ear canal normal.  Left Ear: Tympanic membrane, external ear and ear canal normal.  Nose: Nose normal.  Mouth/Throat: Uvula is midline, oropharynx is clear and moist and mucous membranes are normal. No trismus in the jaw. Abnormal dentition. Dental caries present. No dental abscesses or uvula swelling. No tonsillar abscesses.  Tenderness left maxillary gumline without gross abscess.  Eyes: Conjunctivae, EOM and lids are normal. Pupils are  equal, round, and reactive to light. Right eye exhibits no nystagmus. Left eye exhibits no nystagmus.  Neck: Normal range of motion. Neck supple.  No neck swelling or Ludwig's angina  Cardiovascular: Normal rate and regular rhythm.   Pulmonary/Chest: Effort normal and breath sounds normal.  Abdominal: Soft. There is no tenderness.  Musculoskeletal:       Cervical back: She exhibits normal range of motion, no tenderness and no bony tenderness.  Lymphadenopathy:    She has no cervical adenopathy.  Neurological: She is alert and oriented to person, place, and time. She has normal strength and normal reflexes. No cranial nerve deficit or sensory deficit. She displays a negative Romberg sign. Coordination and gait normal. GCS eye subscore is 4. GCS verbal subscore is 5. GCS motor subscore is 6.  Skin: Skin is warm and dry.  Psychiatric: She has a normal mood and affect.  Nursing note and vitals reviewed.   ED Course  Procedures (including critical care time)  DIAGNOSTIC STUDIES: Oxygen Saturation is 100% on RA, normal by my interpretation.   COORDINATION OF CARE: 2:45 PM-Discussed next steps with pt including a shot of Toradol. Pt verbalized understanding and is agreeable with the plan.   Vital signs reviewed and are as follows: BP 130/69 mmHg  Pulse 84  Temp(Src) 97.9 F (36.6 C) (Oral)  Resp 20  SpO2 100%   Will give IM Toradol for headache as patient is driving home. Encouraged her to rest.  Tramadol as needed for toothache. No signs of infection today. Patient encouraged follow-up with dentist.    MDM   Final diagnoses:  Acute nonintractable headache, unspecified headache type  Toothache   HA: Patient without high-risk features of headache including: sudden onset/thunderclap HA, no similar headache in past, altered mental status, accompanying seizure, headache with exertion, age > 51, history of immunocompromise, neck or shoulder pain, fever, use of anticoagulation,  family history of spontaneous SAH, concomitant drug use, toxic exposure.   Patient has a normal complete neurological exam, normal vital signs, normal level of consciousness, no signs of meningismus, is well-appearing/non-toxic appearing, no signs of trauma.   Imaging with CT/MRI not indicated given history and physical exam findings.   No dangerous or life-threatening conditions suspected or identified by history, physical exam, and by work-up. No indications for hospitalization identified.   Toothache: Patient with toothache. No fever. Exam unconcerning for Ludwig's angina or other deep tissue infection in neck.   As there is no facial swelling or gum findings, will not prescribe antibiotics at this time. Will treat with pain medication.    I personally performed the services described in this documentation, which was scribed in my presence. The recorded information has been reviewed and is accurate.     Jean Crigler, PA-C 02/07/16 1504  Lyndal Pulley, MD 02/08/16 [phone]

## 2016-02-07 NOTE — ED Notes (Signed)
Declined W/C at D/C and was escorted to lobby by RN. 

## 2016-02-07 NOTE — ED Notes (Signed)
Left upper tooth pain x a couple of months makes her head hurt

## 2016-02-07 NOTE — Discharge Instructions (Signed)
Please read and follow all provided instructions.  Your diagnoses today include:  1. Acute nonintractable headache, unspecified headache type   2. Toothache     Tests performed today include:  Vital signs. See below for your results today.   Medications:  In the Emergency Department you received:  Toradol - NSAID medication similar to ibuprofen   Tramadol - narcotic-like pain medication  DO NOT drive or perform any activities that require you to be awake and alert because this medicine can make you drowsy.    Naproxen - anti-inflammatory pain medication  Do not exceed 500mg  naproxen every 12 hours, take with food  You have been prescribed an anti-inflammatory medication or NSAID. Take with food. Take smallest effective dose for the shortest duration needed for your pain. Stop taking if you experience stomach pain or vomiting.   Take any prescribed medications only as directed.  Additional information:  Follow any educational materials contained in this packet.  You are having a headache. No specific cause was found today for your headache. It may have been a migraine or other cause of headache. Stress, anxiety, fatigue, and depression are common triggers for headaches.   Your headache today does not appear to be life-threatening or require hospitalization, but often the exact cause of headaches is not determined in the emergency department. Therefore, follow-up with your doctor is very important to find out what may have caused your headache and whether or not you need any further diagnostic testing or treatment.   Sometimes headaches can appear benign (not harmful), but then more serious symptoms can develop which should prompt an immediate re-evaluation by your doctor or the emergency department.  BE VERY CAREFUL not to take multiple medicines containing Tylenol (also called acetaminophen). Doing so can lead to an overdose which can damage your liver and cause liver failure  and possibly death.   Follow-up instructions: Please follow-up with your primary care provider in the next 3 days for further evaluation of your symptoms.   Return instructions:   Please return to the Emergency Department if you experience worsening symptoms.  Return if the medications do not resolve your headache, if it recurs, or if you have multiple episodes of vomiting or cannot keep down fluids.  Return if you have a change from the usual headache.  RETURN IMMEDIATELY IF you:  Develop a sudden, severe headache  Develop confusion or become poorly responsive or faint  Develop a fever above 100.80F or problem breathing  Have a change in speech, vision, swallowing, or understanding  Develop new weakness, numbness, tingling, incoordination in your arms or legs  Have a seizure  Please return if you have any other emergent concerns.  Additional Information:  Your vital signs today were: BP 130/69 mmHg   Pulse 84   Temp(Src) 97.9 F (36.6 C) (Oral)   Resp 20   SpO2 100% If your blood pressure (BP) was elevated above 135/85 this visit, please have this repeated by your doctor within one month. --------------
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
## 2017-07-21 ENCOUNTER — Ambulatory Visit (INDEPENDENT_AMBULATORY_CARE_PROVIDER_SITE_OTHER): Payer: 59 | Admitting: Physician Assistant

## 2017-07-21 ENCOUNTER — Encounter: Payer: Self-pay | Admitting: Physician Assistant

## 2017-07-21 VITALS — BP 92/64 | HR 70 | Resp 16 | Ht 63.0 in | Wt 197.6 lb

## 2017-07-21 DIAGNOSIS — N898 Other specified noninflammatory disorders of vagina: Secondary | ICD-10-CM | POA: Diagnosis not present

## 2017-07-21 DIAGNOSIS — B373 Candidiasis of vulva and vagina: Secondary | ICD-10-CM | POA: Diagnosis not present

## 2017-07-21 DIAGNOSIS — Z130 Encounter for screening for diseases of the blood and blood-forming organs and certain disorders involving the immune mechanism: Secondary | ICD-10-CM | POA: Diagnosis not present

## 2017-07-21 DIAGNOSIS — B3731 Acute candidiasis of vulva and vagina: Secondary | ICD-10-CM

## 2017-07-21 DIAGNOSIS — Z113 Encounter for screening for infections with a predominantly sexual mode of transmission: Secondary | ICD-10-CM

## 2017-07-21 DIAGNOSIS — D649 Anemia, unspecified: Secondary | ICD-10-CM | POA: Diagnosis not present

## 2017-07-21 DIAGNOSIS — Z124 Encounter for screening for malignant neoplasm of cervix: Secondary | ICD-10-CM

## 2017-07-21 DIAGNOSIS — R82998 Other abnormal findings in urine: Secondary | ICD-10-CM

## 2017-07-21 DIAGNOSIS — Z13228 Encounter for screening for other metabolic disorders: Secondary | ICD-10-CM

## 2017-07-21 DIAGNOSIS — Z1329 Encounter for screening for other suspected endocrine disorder: Secondary | ICD-10-CM

## 2017-07-21 DIAGNOSIS — Z0000 Encounter for general adult medical examination without abnormal findings: Secondary | ICD-10-CM

## 2017-07-21 DIAGNOSIS — Z13220 Encounter for screening for lipoid disorders: Secondary | ICD-10-CM | POA: Diagnosis not present

## 2017-07-21 DIAGNOSIS — Z1389 Encounter for screening for other disorder: Secondary | ICD-10-CM

## 2017-07-21 LAB — POC MICROSCOPIC URINALYSIS (UMFC): MUCUS RE: ABSENT

## 2017-07-21 LAB — POCT WET + KOH PREP
Trich by wet prep: ABSENT
Yeast by wet prep: ABSENT

## 2017-07-21 LAB — POCT URINALYSIS DIP (MANUAL ENTRY)
Bilirubin, UA: NEGATIVE
GLUCOSE UA: NEGATIVE mg/dL
Ketones, POC UA: NEGATIVE mg/dL
Nitrite, UA: NEGATIVE
Protein Ur, POC: NEGATIVE mg/dL
Spec Grav, UA: 1.025 (ref 1.010–1.025)
Urobilinogen, UA: 0.2 E.U./dL
pH, UA: 7 (ref 5.0–8.0)

## 2017-07-21 LAB — POCT URINE PREGNANCY: PREG TEST UR: NEGATIVE

## 2017-07-21 MED ORDER — FLUCONAZOLE 150 MG PO TABS: 150.0000 mg | ORAL_TABLET | Freq: Once | ORAL | 0 refills | Status: AC

## 2017-07-21 NOTE — Progress Notes (Signed)
[place]  MRN: [id number] DOB: 1985-09-10  Subjective:  Pt is a 31 y.o. female who presents for annual physical exam. Pt is fasting today.   Last annual: Years ago Last dental exam: Years ago, brushes twice a day.  Last vision exam: Years ago Last pap smear: 2014  Menstrual cycles: Typically regular. Occur every 21-28 days. Last about 5-6 days.   Vaccinations      Influenza: Never      Tetanus: 2014      Acute Problems: 1) vaginal discharge and vaginal odor intermittently x 2 weeks. She denies dysuria, hematuria, urinary frequency, urinary urgency, flank pain, abdominal pain, pelvic pain and genital irritation. Has not tried anything for relief. LMP 07/02/17. She is sexually active with monogamous partner. Last time she was tested for STDs was quite some time ago.  Patient Active Problem List   Diagnosis Date Noted  . OBESITY 02/04/2010  . FATIGUE 02/04/2010  . WEIGHT GAIN 02/04/2010    No current outpatient medications on file prior to visit.   No current facility-administered medications on file prior to visit.     No Known Allergies  Social History   Socioeconomic History  . Marital status: Significant Other    Spouse name: None  . Number of children: 2  . Years of education: College  . Highest education level: Associate degree: academic program  Social Needs  . Financial resource strain: Not hard at all  . Food insecurity - worry: Never true  . Food insecurity - inability: Never true  . Transportation needs - medical: No  . Transportation needs - non-medical: No  Occupational History  . Occupation: Health Concierge     Comment: [employer]   Tobacco Use  . Smoking status: Never Smoker  . Smokeless tobacco: Never Used  Substance and Sexual Activity  . Alcohol use: No  . Drug use: No  . Sexual activity: Yes    Partners: Male    Birth control/protection: None  Other Topics Concern  . None  Social History Narrative   Pt is from [place]. Patient  lives at home with 2 children (ages 28 and 53).      Diet: She loves fruits, vegetables, and seafood. Loves cheese and milk.   Drinks sweet tea and juices. Does not drink a lot of water. Does not take a daily multivitamin daily.      Exercise: Walks daily and stretches a few times a week.           Past Surgical History:  Procedure Laterality Date  . CESAREAN SECTION    . NO PAST SURGERIES      Family History  Problem Relation Age of Onset  . Hypertension Mother   . Colon cancer Father   . Pulmonary embolism Father   . Stroke Maternal Grandmother   . Hypertension Maternal Grandmother   . Other Neg Hx     Review of Systems  Constitutional: Negative for activity change, appetite change, chills, diaphoresis, fatigue, fever and unexpected weight change.  HENT: Negative for congestion, dental problem, drooling, ear discharge, ear pain, facial swelling, hearing loss, mouth sores, nosebleeds, postnasal drip, rhinorrhea, sinus pressure, sinus pain, sneezing, sore throat, tinnitus, trouble swallowing and voice change.   Eyes: Negative for photophobia, pain, discharge, redness, itching and visual disturbance.  Respiratory: Negative for apnea, cough, choking, chest tightness, shortness of breath, wheezing and stridor.   Cardiovascular: Negative for chest pain, palpitations and leg swelling.  Gastrointestinal: Negative for abdominal distention, abdominal  pain, anal bleeding, blood in stool, constipation, diarrhea, nausea, rectal pain and vomiting.  Endocrine: Negative for cold intolerance, heat intolerance, polydipsia, polyphagia and polyuria.  Genitourinary: Positive for vaginal discharge. Negative for decreased urine volume, difficulty urinating, dyspareunia, dysuria, enuresis, flank pain, frequency, genital sores, hematuria, menstrual problem, pelvic pain, urgency, vaginal bleeding and vaginal pain.  Musculoskeletal: Negative for arthralgias, back pain, gait problem, joint swelling,  myalgias, neck pain and neck stiffness.  Skin: Negative for color change, pallor, rash and wound.  Allergic/Immunologic: Negative for environmental allergies, food allergies and immunocompromised state.  Neurological: Negative for dizziness, tremors, seizures, syncope, facial asymmetry, speech difficulty, weakness, light-headedness, numbness and headaches.  Hematological: Negative for adenopathy. Does not bruise/bleed easily.  Psychiatric/Behavioral: Negative for agitation, behavioral problems, confusion, decreased concentration, dysphoric mood, hallucinations, self-injury, sleep disturbance and suicidal ideas. The patient is not nervous/anxious and is not hyperactive.     Objective:  BP 92/64   Pulse 70   Resp 16   Ht 5' 3" (1.6 m)   Wt 197 lb 9.6 oz (89.6 kg)   LMP 07/02/2017   SpO2 93%   BMI 35.00 kg/m   Physical Exam  Constitutional: She is oriented to person, place, and time and well-developed, well-nourished, and in no distress.  HENT:  Head: Normocephalic and atraumatic.  Right Ear: Hearing, tympanic membrane, external ear and ear canal normal.  Left Ear: Hearing, tympanic membrane, external ear and ear canal normal.  Nose: Nose normal.  Mouth/Throat: Uvula is midline, oropharynx is clear and moist and mucous membranes are normal. No oropharyngeal exudate.  Eyes: Conjunctivae, EOM and lids are normal. Pupils are equal, round, and reactive to light. No scleral icterus.  Neck: Trachea normal and normal range of motion. No thyroid mass and no thyromegaly present.  Cardiovascular: Normal rate, regular rhythm, normal heart sounds and intact distal pulses.  Pulmonary/Chest: Effort normal and breath sounds normal.  Abdominal: Soft. Normal appearance and bowel sounds are normal. There is no tenderness.  Genitourinary: Uterus normal, cervix normal, right adnexa normal, left adnexa normal and vulva normal. Thick  curdy and vaginal discharge found.  Lymphadenopathy:       Head (right  side): No tonsillar, no preauricular, no posterior auricular and no occipital adenopathy present.       Head (left side): No tonsillar, no preauricular, no posterior auricular and no occipital adenopathy present.    She has no cervical adenopathy.       Right: No supraclavicular adenopathy present.       Left: No supraclavicular adenopathy present.  Neurological: She is alert and oriented to person, place, and time. She has normal sensation, normal strength and normal reflexes. Gait normal.  Skin: Skin is warm and dry.  Psychiatric: Affect normal.   No exam data present  Results for orders placed or performed in visit on 07/21/17 (from the past 24 hour(s))  POCT Wet + KOH Prep     Status: Abnormal   Collection Time: 07/21/17  8:59 AM  Result Value Ref Range   Yeast by KOH Present (A) Absent   Yeast by wet prep Absent Absent   WBC by wet prep Few Few   Clue Cells Wet Prep HPF POC None None   Trich by wet prep Absent Absent   Bacteria Wet Prep HPF POC None (A) Few   Epithelial Cells By [employer] Pref ([facility]) Many (A) None, Few, Too numerous to count   RBC,UR,HPF,POC None None RBC/hpf  POCT urinalysis dipstick     Status:  Abnormal   Collection Time: 07/21/17  9:38 AM  Result Value Ref Range   Color, UA yellow yellow   Clarity, UA clear clear   Glucose, UA negative negative mg/dL   Bilirubin, UA negative negative   Ketones, POC UA negative negative mg/dL   Spec Grav, UA 1.025 1.010 - 1.025   Blood, UA large (A) negative   pH, UA 7.0 5.0 - 8.0   Protein Ur, POC negative negative mg/dL   Urobilinogen, UA 0.2 0.2 or 1.0 E.U./dL   Nitrite, UA Negative Negative   Leukocytes, UA Trace (A) Negative  POCT urine pregnancy     Status: Normal   Collection Time: 07/21/17  9:46 AM  Result Value Ref Range   Preg Test, Ur Negative Negative  POCT Microscopic Urinalysis ([facility])     Status: Abnormal   Collection Time: 07/21/17  9:52 AM  Result Value Ref Range   WBC,UR,HPF,POC Few (A) None WBC/hpf    RBC,UR,HPF,POC None None RBC/hpf   Bacteria Few (A) None, Too numerous to count   Mucus Absent Absent   Epithelial Cells, UR Per Microscopy Moderate (A) None, Too numerous to count cells/hpf     Assessment and Plan :  Discussed healthy lifestyle, diet, exercise, preventative care, vaccinations, and addressed patient's concerns. Plan for follow up in one year. Otherwise, plan for specific conditions below.  1. Annual physical exam Await lab results  2. Vaginal discharge - POCT Wet + KOH Prep - POCT urine pregnancy  3. Anemia, unspecified type - CBC with Differential/Platelet  4. Screening for endocrine, metabolic and immunity disorder - CMP14+EGFR  5. Screening, lipid - Lipid panel  6. Screening for thyroid disorder - TSH  7. Screening for cervical cancer - Pap IG, CT/NG w/ reflex HPV when ASC-U  8. Screening for hematuria or proteinuria - POCT urinalysis dipstick  9. Screen for STD (sexually transmitted disease) - Hepatitis panel, acute - GC/Chlamydia Probe Amp - HIV antibody - RPR  10. Leukocytes in urine - POCT Microscopic Urinalysis ([facility])  11. Yeast vaginitis Wet prep shows yeast.  We will treat accordingly. - fluconazole (DIFLUCAN) 150 MG tablet; Take 1 tablet (150 mg total) once for 1 dose by mouth.  Dispense: 1 tablet; Refill: 0  Tenna Delaine, PA-C  [facility] 07/21/2017 1:10 PM

## 2017-07-21 NOTE — Patient Instructions (Addendum)
Your results show you have a yeast infection. We are going to treat this with diflucan, which is a single tablet. You should have improvement in 48 hours.   In terms of overall health, your physical exam was normal. Keep up the good work. Try to increase your water consumption and decrease your sweet tea consumption. We should have your lab work back in one week and will contact you with those results.   Thank you for letting me participate in your health and well being.   Vaginal Yeast infection, Adult Vaginal yeast infection is a condition that causes soreness, swelling, and redness (inflammation) of the vagina. It also causes vaginal discharge. This is a common condition. Some women get this infection frequently. What are the causes? This condition is caused by a change in the normal balance of the yeast (candida) and bacteria that live in the vagina. This change causes an overgrowth of yeast, which causes the inflammation. What increases the risk? This condition is more likely to develop in:  Women who take antibiotic medicines.  Women who have diabetes.  Women who take birth control pills.  Women who are pregnant.  Women who douche often.  Women who have a weak defense (immune) system.  Women who have been taking steroid medicines for a long time.  Women who frequently wear tight clothing.  What are the signs or symptoms? Symptoms of this condition include:  White, thick vaginal discharge.  Swelling, itching, redness, and irritation of the vagina. The lips of the vagina (vulva) may be affected as well.  Pain or a burning feeling while urinating.  Pain during sex.  How is this diagnosed? This condition is diagnosed with a medical history and physical exam. This will include a pelvic exam. Your health care provider will examine a sample of your vaginal discharge under a microscope. Your health care provider may send this sample for testing to confirm the diagnosis. How  is this treated? This condition is treated with medicine. Medicines may be over-the-counter or prescription. You may be told to use one or more of the following:  Medicine that is taken orally.  Medicine that is applied as a cream.  Medicine that is inserted directly into the vagina (suppository).  Follow these instructions at home:  Take or apply over-the-counter and prescription medicines only as told by your health care provider.  Do not have sex until your health care provider has approved. Tell your sex partner that you have a yeast infection. That person should go to his or her health care provider if he or she develops symptoms.  Do not wear tight clothes, such as pantyhose or tight pants.  Avoid using tampons until your health care provider approves.  Eat more yogurt. This may help to keep your yeast infection from returning.  Try taking a sitz bath to help with discomfort. This is a warm water bath that is taken while you are sitting down. The water should only come up to your hips and should cover your buttocks. Do this 3-4 times per day or as told by your health care provider.  Do not douche.  Wear breathable, cotton underwear.  If you have diabetes, keep your blood sugar levels under control. Contact a health care provider if:  You have a fever.  Your symptoms go away and then return.  Your symptoms do not get better with treatment.  Your symptoms get worse.  You have new symptoms.  You develop blisters in or around your  vagina.  You have blood coming from your vagina and it is not your menstrual period.  You develop pain in your abdomen. This information is not intended to replace advice given to you by your health care provider. Make sure you discuss any questions you have with your health care provider. Document Released: 05/31/2005 Document Revised: 02/02/2016 Document Reviewed: 02/22/2015 Elsevier Interactive Patient Education  2018 [place] Maintenance, Female Adopting a healthy lifestyle and getting preventive care can go a long way to promote health and wellness. Talk with your health care provider about what schedule of regular examinations is right for you. This is a good chance for you to check in with your provider about disease prevention and staying healthy. In between checkups, there are plenty of things you can do on your own. Experts have done a lot of research about which lifestyle changes and preventive measures are most likely to keep you healthy. Ask your health care provider for more information. Weight and diet Eat a healthy diet  Be sure to include plenty of vegetables, fruits, low-fat dairy products, and lean protein.  Do not eat a lot of foods high in solid fats, added sugars, or salt.  Get regular exercise. This is one of the most important things you can do for your health. ? Most adults should exercise for at least 150 minutes each week. The exercise should increase your heart rate and make you sweat (moderate-intensity exercise). ? Most adults should also do strengthening exercises at least twice a week. This is in addition to the moderate-intensity exercise.  Maintain a healthy weight  Body mass index (BMI) is a measurement that can be used to identify possible weight problems. It estimates body fat based on height and weight. Your health care provider can help determine your BMI and help you achieve or maintain a healthy weight.  For females 70 years of age and older: ? A BMI below 18.5 is considered underweight. ? A BMI of 18.5 to 24.9 is normal. ? A BMI of 25 to 29.9 is considered overweight. ? A BMI of 30 and above is considered obese.  Watch levels of cholesterol and blood lipids  You should start having your blood tested for lipids and cholesterol at 29 years of age, then have this test every 5 years.  You may need to have your cholesterol levels checked more often if: ? Your  lipid or cholesterol levels are high. ? You are older than 29 years of age. ? You are at high risk for heart disease.  Cancer screening Lung Cancer  Lung cancer screening is recommended for adults 66-79 years old who are at high risk for lung cancer because of a history of smoking.  A yearly low-dose CT scan of the lungs is recommended for people who: ? Currently smoke. ? Have quit within the past 15 years. ? Have at least a 30-pack-year history of smoking. A pack year is smoking an average of one pack of cigarettes a day for 1 year.  Yearly screening should continue until it has been 15 years since you quit.  Yearly screening should stop if you develop a health problem that would prevent you from having lung cancer treatment.  Breast Cancer  Practice breast self-awareness. This means understanding how your breasts normally appear and feel.  It also means doing regular breast self-exams. Let your health care provider know about any changes, no matter how small.  If you are in your 20s or 30s,  you should have a clinical breast exam (CBE) by a health care provider every 1-3 years as part of a regular health exam.  If you are 34 or older, have a CBE every year. Also consider having a breast X-ray (mammogram) every year.  If you have a family history of breast cancer, talk to your health care provider about genetic screening.  If you are at high risk for breast cancer, talk to your health care provider about having an MRI and a mammogram every year.  Breast cancer gene (BRCA) assessment is recommended for women who have family members with BRCA-related cancers. BRCA-related cancers include: ? Breast. ? Ovarian. ? Tubal. ? Peritoneal cancers.  Results of the assessment will determine the need for genetic counseling and BRCA1 and BRCA2 testing.  Cervical Cancer Your health care provider may recommend that you be screened regularly for cancer of the pelvic organs (ovaries, uterus,  and vagina). This screening involves a pelvic examination, including checking for microscopic changes to the surface of your cervix (Pap test). You may be encouraged to have this screening done every 3 years, beginning at age 5.  For women ages 55-65, health care providers may recommend pelvic exams and Pap testing every 3 years, or they may recommend the Pap and pelvic exam, combined with testing for human papilloma virus (HPV), every 5 years. Some types of HPV increase your risk of cervical cancer. Testing for HPV may also be done on women of any age with unclear Pap test results.  Other health care providers may not recommend any screening for nonpregnant women who are considered low risk for pelvic cancer and who do not have symptoms. Ask your health care provider if a screening pelvic exam is right for you.  If you have had past treatment for cervical cancer or a condition that could lead to cancer, you need Pap tests and screening for cancer for at least 20 years after your treatment. If Pap tests have been discontinued, your risk factors (such as having a new sexual partner) need to be reassessed to determine if screening should resume. Some women have medical problems that increase the chance of getting cervical cancer. In these cases, your health care provider may recommend more frequent screening and Pap tests.  Colorectal Cancer  This type of cancer can be detected and often prevented.  Routine colorectal cancer screening usually begins at 29 years of age and continues through 29 years of age.  Your health care provider may recommend screening at an earlier age if you have risk factors for colon cancer.  Your health care provider may also recommend using home test kits to check for hidden blood in the stool.  A small camera at the end of a tube can be used to examine your colon directly (sigmoidoscopy or colonoscopy). This is done to check for the earliest forms of colorectal  cancer.  Routine screening usually begins at age 14.  Direct examination of the colon should be repeated every 5-10 years through 29 years of age. However, you may need to be screened more often if early forms of precancerous polyps or small growths are found.  Skin Cancer  Check your skin from head to toe regularly.  Tell your health care provider about any new moles or changes in moles, especially if there is a change in a mole's shape or color.  Also tell your health care provider if you have a mole that is larger than the size of a pencil eraser.  Always use sunscreen. Apply sunscreen liberally and repeatedly throughout the day.  Protect yourself by wearing long sleeves, pants, a wide-brimmed hat, and sunglasses whenever you are outside.  Heart disease, diabetes, and high blood pressure  High blood pressure causes heart disease and increases the risk of stroke. High blood pressure is more likely to develop in: ? People who have blood pressure in the high end of the normal range (130-139/85-89 mm Hg). ? People who are overweight or obese. ? People who are African American.  If you are 67-30 years of age, have your blood pressure checked every 3-5 years. If you are 12 years of age or older, have your blood pressure checked every year. You should have your blood pressure measured twice-once when you are at a hospital or clinic, and once when you are not at a hospital or clinic. Record the average of the two measurements. To check your blood pressure when you are not at a hospital or clinic, you can use: ? An automated blood pressure machine at a pharmacy. ? A home blood pressure monitor.  If you are between 3 years and 73 years old, ask your health care provider if you should take aspirin to prevent strokes.  Have regular diabetes screenings. This involves taking a blood sample to check your fasting blood sugar level. ? If you are at a normal weight and have a low risk for diabetes,  have this test once every three years after 29 years of age. ? If you are overweight and have a high risk for diabetes, consider being tested at a younger age or more often. Preventing infection Hepatitis B  If you have a higher risk for hepatitis B, you should be screened for this virus. You are considered at high risk for hepatitis B if: ? You were born in a country where hepatitis B is common. Ask your health care provider which countries are considered high risk. ? Your parents were born in a high-risk country, and you have not been immunized against hepatitis B (hepatitis B vaccine). ? You have HIV or AIDS. ? You use needles to inject street drugs. ? You live with someone who has hepatitis B. ? You have had sex with someone who has hepatitis B. ? You get hemodialysis treatment. ? You take certain medicines for conditions, including cancer, organ transplantation, and autoimmune conditions.  Hepatitis C  Blood testing is recommended for: ? Everyone born from [birth year] through 1963. ? Anyone with known risk factors for hepatitis C.  Sexually transmitted infections (STIs)  You should be screened for sexually transmitted infections (STIs) including gonorrhea and chlamydia if: ? You are sexually active and are younger than 29 years of age. ? You are older than 29 years of age and your health care provider tells you that you are at risk for this type of infection. ? Your sexual activity has changed since you were last screened and you are at an increased risk for chlamydia or gonorrhea. Ask your health care provider if you are at risk.  If you do not have HIV, but are at risk, it may be recommended that you take a prescription medicine daily to prevent HIV infection. This is called pre-exposure prophylaxis (PrEP). You are considered at risk if: ? You are sexually active and do not regularly use condoms or know the HIV status of your partner(s). ? You take drugs by injection. ? You are  sexually active with a partner who has HIV.  Talk with  your health care provider about whether you are at high risk of being infected with HIV. If you choose to begin PrEP, you should first be tested for HIV. You should then be tested every 3 months for as long as you are taking PrEP. Pregnancy  If you are premenopausal and you may become pregnant, ask your health care provider about preconception counseling.  If you may become pregnant, take 400 to 800 micrograms (mcg) of folic acid every day.  If you want to prevent pregnancy, talk to your health care provider about birth control (contraception). Osteoporosis and menopause  Osteoporosis is a disease in which the bones lose minerals and strength with aging. This can result in serious bone fractures. Your risk for osteoporosis can be identified using a bone density scan.  If you are 20 years of age or older, or if you are at risk for osteoporosis and fractures, ask your health care provider if you should be screened.  Ask your health care provider whether you should take a calcium or vitamin D supplement to lower your risk for osteoporosis.  Menopause may have certain physical symptoms and risks.  Hormone replacement therapy may reduce some of these symptoms and risks. Talk to your health care provider about whether hormone replacement therapy is right for you. Follow these instructions at home:  Schedule regular health, dental, and eye exams.  Stay current with your immunizations.  Do not use any tobacco products including cigarettes, chewing tobacco, or electronic cigarettes.  If you are pregnant, do not drink alcohol.  If you are breastfeeding, limit how much and how often you drink alcohol.  Limit alcohol intake to no more than 1 drink per day for nonpregnant women. One drink equals 12 ounces of beer, 5 ounces of wine, or 1 ounces of hard liquor.  Do not use street drugs.  Do not share needles.  Ask your health care  provider for help if you need support or information about quitting drugs.  Tell your health care provider if you often feel depressed.  Tell your health care provider if you have ever been abused or do not feel safe at home. This information is not intended to replace advice given to you by your health care provider. Make sure you discuss any questions you have with your health care provider. Document Released: 03/06/2011 Document Revised: 01/27/2016 Document Reviewed: 05/25/2015 Elsevier Interactive Patient Education  2018 [employer].    IF you received an x-ray today, you will receive an invoice from [facility]. Please contact [facility] at [phone] with questions or concerns regarding your invoice.   IF you received labwork today, you will receive an invoice from [facility]. Please contact [facility] at [phone] with questions or concerns regarding your invoice.   Our billing staff will not be able to assist you with questions regarding bills from these companies.  You will be contacted with the lab results as soon as they are available. The fastest way to get your results is to activate your My Chart account. Instructions are located on the last page of this paperwork. If you have not heard from [place] regarding the results in 2 weeks, please contact this office.

## 2017-07-22 LAB — CMP14+EGFR
A/G RATIO: 1.3 (ref 1.2–2.2)
ALBUMIN: 4.3 g/dL (ref 3.5–5.5)
ALT: 10 IU/L (ref 0–32)
AST: 12 IU/L (ref 0–40)
Alkaline Phosphatase: 64 IU/L (ref 39–117)
BUN/Creatinine Ratio: 11 (ref 9–23)
BUN: 8 mg/dL (ref 6–20)
Bilirubin Total: 0.3 mg/dL (ref 0.0–1.2)
CALCIUM: 9.4 mg/dL (ref 8.7–10.2)
CO2: 21 mmol/L (ref 20–29)
CREATININE: 0.72 mg/dL (ref 0.57–1.00)
Chloride: 103 mmol/L (ref 96–106)
GFR calc non Af Amer: 114 mL/min/{1.73_m2} (ref >59)
GFR, EST AFRICAN AMERICAN: 131 mL/min/{1.73_m2} (ref >59)
GLOBULIN, TOTAL: 3.2 g/dL (ref 1.5–4.5)
Glucose: 82 mg/dL (ref 65–99)
POTASSIUM: 4 mmol/L (ref 3.5–5.2)
Sodium: 139 mmol/L (ref 134–144)
TOTAL PROTEIN: 7.5 g/dL (ref 6.0–8.5)

## 2017-07-22 LAB — CBC WITH DIFFERENTIAL/PLATELET
BASOS: 0 %
Basophils Absolute: 0 10*3/uL (ref 0.0–0.2)
EOS (ABSOLUTE): 0.1 10*3/uL (ref 0.0–0.4)
EOS: 1 %
HEMATOCRIT: 28.3 % — AB (ref 34.0–46.6)
HEMOGLOBIN: 8.1 g/dL — AB (ref 11.1–15.9)
IMMATURE GRANS (ABS): 0 10*3/uL (ref 0.0–0.1)
IMMATURE GRANULOCYTES: 0 %
Lymphocytes Absolute: 1.9 10*3/uL (ref 0.7–3.1)
Lymphs: 21 %
MCH: 19.2 pg — ABNORMAL LOW (ref 26.6–33.0)
MCHC: 28.6 g/dL — ABNORMAL LOW (ref 31.5–35.7)
MCV: 67 fL — AB (ref 79–97)
MONOCYTES: 7 %
Monocytes Absolute: 0.6 10*3/uL (ref 0.1–0.9)
Neutrophils Absolute: 6.5 10*3/uL (ref 1.4–7.0)
Neutrophils: 71 %
Platelets: 422 10*3/uL — ABNORMAL HIGH (ref 150–379)
RBC: 4.21 x10E6/uL (ref 3.77–5.28)
RDW: 17.6 % — ABNORMAL HIGH (ref 12.3–15.4)
WBC: 9 10*3/uL (ref 3.4–10.8)

## 2017-07-22 LAB — LIPID PANEL
Chol/HDL Ratio: 3.8 ratio (ref 0.0–4.4)
Cholesterol, Total: 173 mg/dL (ref 100–199)
HDL: 46 mg/dL (ref >39)
LDL Calculated: 107 mg/dL — ABNORMAL HIGH (ref 0–99)
TRIGLYCERIDES: 100 mg/dL (ref 0–149)
VLDL Cholesterol Cal: 20 mg/dL (ref 5–40)

## 2017-07-22 LAB — HIV ANTIBODY (ROUTINE TESTING W REFLEX): HIV Screen 4th Generation wRfx: NONREACTIVE

## 2017-07-22 LAB — HEPATITIS PANEL, ACUTE
HEP B C IGM: NEGATIVE
HEP B S AG: NEGATIVE
Hep A IgM: NEGATIVE
Hep C Virus Ab: <0.1 s/co ratio (ref 0.0–0.9)

## 2017-07-22 LAB — TSH: TSH: 1.01 u[IU]/mL (ref 0.450–4.500)

## 2017-07-22 LAB — RPR: RPR: NONREACTIVE

## 2017-07-23 ENCOUNTER — Telehealth: Payer: Self-pay | Admitting: Physician Assistant

## 2017-07-23 NOTE — Telephone Encounter (Signed)
Pt. called to report new symptoms since she saw Benjiman Core[place]ittany [place], [place] on 11/17.  Reported she had a pregnancy test on 11/17, that was negative, but stated "it may have been checked too early."  Reported light bleeding with intercourse; described "light like when my period starts."  LMP was 10/29.  Denied any vaginal bleeding at present.  Also c/o breast tenderness, intermittent nausea, headache, and pelvic pain.  Questioned if the pelvic pain occurred after intercourse; pt. stated it was there before and after intercourse; rated pain at 6/10.  Stated the above symptoms were noted after her appt. With Benjiman CoreBrittany Wiseman, PA on 11/17.  Denied any fever/ chills.  Advised will make B. Wiseman aware.

## 2017-07-23 NOTE — Telephone Encounter (Signed)
informational

## 2017-07-24 ENCOUNTER — Other Ambulatory Visit: Payer: Self-pay | Admitting: Physician Assistant

## 2017-07-24 DIAGNOSIS — A549 Gonococcal infection, unspecified: Secondary | ICD-10-CM

## 2017-07-24 LAB — GC/CHLAMYDIA PROBE AMP
CHLAMYDIA, DNA PROBE: NEGATIVE
Neisseria gonorrhoeae by PCR: POSITIVE — AB

## 2017-07-24 MED ORDER — AZITHROMYCIN 500 MG PO TABS: 1000.0000 mg | ORAL_TABLET | Freq: Every day | ORAL | 0 refills | Status: DC

## 2017-07-24 MED ORDER — CEFTRIAXONE SODIUM 250 MG IJ SOLR
250.0000 mg | Freq: Once | INTRAMUSCULAR | Status: AC
  Administered 2017-07-31: 250 mg via INTRAMUSCULAR

## 2017-07-24 NOTE — Telephone Encounter (Signed)
Please advise 

## 2017-07-24 NOTE — Progress Notes (Signed)
Meds ordered this encounter  Medications  . azithromycin (ZITHROMAX) 500 MG tablet    Sig: Take 2 tablets (1,000 mg total) by mouth daily.    Dispense:  2 tablet    Refill:  0    Order Specific Question:   Supervising Provider    Answer:   Ethelda ChickSMITH, KRISTI M [[id number]]  . cefTRIAXone (ROCEPHIN) injection 250 mg

## 2017-07-24 NOTE — Telephone Encounter (Signed)
Pt is calling again & and is concerned, please call her. Thanks [phone]

## 2017-07-24 NOTE — Telephone Encounter (Signed)
I am currently away from the office. Please call patient. Her pregnancy test was negative in office so it is unlikely that she is pregnant. If she believes it could have been falsely negative, she can take an over the counter pregnancy test, which she can purchase at any pharmacy.  In terms of her lab results, she did test positive for gonorrhea and therefore will need treatment immediately. This could cause symptoms such as vaginal discharge and pelvic pain. She will need to pick up a medication called azithromycin, which I have sent to the pharmacy and will also need to come into the office for an injection as soon as possible. She will also need to notify any sexual partners she has that she has tested positive for gonorrhea as they will also need to be treated. Please have her schedule an appointment today to have the injection as we are closed on Thanksgiving. Please let me know when you have called. Thanks!

## 2017-07-25 LAB — PAP IG, CT-NG, RFX HPV ASCU
Chlamydia, Nuc. Acid Amp: NEGATIVE
Gonococcus by Nucleic Acid Amp: POSITIVE — AB
PAP SMEAR COMMENT: 0

## 2017-07-25 NOTE — Telephone Encounter (Signed)
Called pt this Morning and LVM for her to give us a call back here at the office.

## 2017-07-25 NOTE — Telephone Encounter (Signed)
Spoke with about labs and informed her of providers instructions. Also informed her to come into the office today for injection when she has the chance. Pt verbalized understanding.

## 2017-07-27 ENCOUNTER — Telehealth: Payer: Self-pay | Admitting: Physician Assistant

## 2017-07-27 NOTE — Telephone Encounter (Signed)
Called patient to discuss lab results.  She did not answer.  Left voicemail that I would try again later.

## 2017-07-30 ENCOUNTER — Other Ambulatory Visit: Payer: Self-pay | Admitting: Physician Assistant

## 2017-07-30 DIAGNOSIS — D649 Anemia, unspecified: Secondary | ICD-10-CM

## 2017-07-30 NOTE — Progress Notes (Signed)
Orders Placed This Encounter  Procedures  . Iron, TIBC and Ferritin Panel    Standing Status:   Future    Standing Expiration Date:   08/06/2017

## 2017-07-31 ENCOUNTER — Encounter: Payer: Self-pay | Admitting: Physician Assistant

## 2017-07-31 ENCOUNTER — Ambulatory Visit (INDEPENDENT_AMBULATORY_CARE_PROVIDER_SITE_OTHER): Payer: 59 | Admitting: Physician Assistant

## 2017-07-31 DIAGNOSIS — A549 Gonococcal infection, unspecified: Secondary | ICD-10-CM | POA: Diagnosis not present

## 2017-07-31 DIAGNOSIS — D649 Anemia, unspecified: Secondary | ICD-10-CM

## 2017-07-31 NOTE — Progress Notes (Signed)
I did not personally examine this patient.  

## 2017-08-01 LAB — IRON,TIBC AND FERRITIN PANEL
Ferritin: 7 ng/mL — ABNORMAL LOW (ref 15–150)
IRON SATURATION: 2 % — AB (ref 15–55)
Iron: 10 ug/dL — ABNORMAL LOW (ref 27–159)
Total Iron Binding Capacity: 435 ug/dL (ref 250–450)
UIBC: 425 ug/dL (ref 131–425)

## 2017-08-06 ENCOUNTER — Other Ambulatory Visit: Payer: Self-pay | Admitting: Physician Assistant

## 2017-08-06 ENCOUNTER — Encounter: Payer: Self-pay | Admitting: Physician Assistant

## 2017-08-06 DIAGNOSIS — D649 Anemia, unspecified: Secondary | ICD-10-CM

## 2017-08-06 MED ORDER — FERROUS SULFATE 325 (65 FE) MG PO TABS: 325.0000 mg | ORAL_TABLET | Freq: Every day | ORAL | 1 refills | Status: DC

## 2017-08-06 NOTE — Progress Notes (Signed)
Pt has been contacted and informed of low iron.  We will add additional lab work at this time.  Patient does endorse menorrhagia.  Notes she will go through 5 tampons or pads daily on her cycles.  She also has occasional abdominal discomfort.  She is not a vegan.  She denies any bloating, hematochezia, dizziness, and lightheadedness.  Have sent in prescription for oral iron supplementation.  Has no other questions or complaints.  Orders Placed This Encounter  Procedures  . Pathologist smear review    Standing Status:   Future    Standing Expiration Date:   08/06/2018  . Reticulocytes    Standing Status:   Future    Standing Expiration Date:   08/06/2018  . Celiac Disease Ab Screen w/Rfx    Standing Status:   Future    Standing Expiration Date:   08/06/2018  . Hemoglobinopathy evaluation    Standing Status:   Future    Standing Expiration Date:   08/06/2018   Meds ordered this encounter  Medications  . ferrous sulfate 325 (65 FE) MG tablet    Sig: Take 1 tablet (325 mg total) by mouth daily with breakfast.    Dispense:  90 tablet    Refill:  1    Order Specific Question:   Supervising Provider    Answer:   Ethelda ChickSMITH, KRISTI M [[id number]]

## 2017-08-07 ENCOUNTER — Encounter: Payer: Self-pay | Admitting: Physician Assistant

## 2017-08-08 ENCOUNTER — Encounter: Payer: Self-pay | Admitting: Physician Assistant

## 2017-11-09 ENCOUNTER — Encounter (HOSPITAL_COMMUNITY): Payer: Self-pay | Admitting: Emergency Medicine

## 2017-11-09 ENCOUNTER — Ambulatory Visit (HOSPITAL_COMMUNITY)
Admission: EM | Admit: 2017-11-09 | Discharge: 2017-11-09 | Disposition: A | Discharge status: Home / Self Care | Payer: Self-pay | Attending: Internal Medicine | Admitting: Internal Medicine

## 2017-11-09 DIAGNOSIS — Z79899 Other long term (current) drug therapy: Secondary | ICD-10-CM | POA: Insufficient documentation

## 2017-11-09 DIAGNOSIS — R102 Pelvic and perineal pain: Secondary | ICD-10-CM | POA: Insufficient documentation

## 2017-11-09 LAB — POCT URINALYSIS DIP (DEVICE)
BILIRUBIN URINE: NEGATIVE
Glucose, UA: NEGATIVE mg/dL
HGB URINE DIPSTICK: NEGATIVE
Ketones, ur: NEGATIVE mg/dL
Leukocytes, UA: NEGATIVE
NITRITE: NEGATIVE
Protein, ur: NEGATIVE mg/dL
Specific Gravity, Urine: 1.025 (ref 1.005–1.030)
Urobilinogen, UA: 0.2 mg/dL (ref 0.0–1.0)
pH: 6.5 (ref 5.0–8.0)

## 2017-11-09 LAB — POCT PREGNANCY, URINE: Preg Test, Ur: NEGATIVE

## 2017-11-09 MED ORDER — FLUCONAZOLE 150 MG PO TABS: 150.0000 mg | ORAL_TABLET | Freq: Every day | ORAL | 0 refills | Status: DC

## 2017-11-09 MED ORDER — DOXYCYCLINE HYCLATE 100 MG PO CAPS: 100.0000 mg | ORAL_CAPSULE | Freq: Two times a day (BID) | ORAL | 0 refills | Status: AC

## 2017-11-09 MED ORDER — METRONIDAZOLE 500 MG PO TABS: 500.0000 mg | ORAL_TABLET | Freq: Two times a day (BID) | ORAL | 0 refills | Status: AC

## 2017-11-09 MED ORDER — CEFTRIAXONE SODIUM 250 MG IJ SOLR
250.0000 mg | Freq: Once | INTRAMUSCULAR | Status: AC
  Administered 2017-11-09: 250 mg via INTRAMUSCULAR

## 2017-11-09 MED ORDER — CEFTRIAXONE SODIUM 250 MG IJ SOLR
INTRAMUSCULAR | Status: AC
  Filled 2017-11-09: qty 250

## 2017-11-09 NOTE — Discharge Instructions (Signed)
Your urine was negative for UTI, pregnancy.  As discussed, I am treating you for pelvic inflammatory disease.  Rocephin 250 mg injection given in office today.  Start doxycycline and Flagyl as directed.  Refrain from alcohol use while on Flagyl.  I am also cover you for yeast infection due to discharge seen during your exam, start diflucan as directed.  Cytology sent, you will be contacted with any positive results that requires further treatment. Refrain from sexual activity for the next 14 days. Monitor for any worsening of symptoms, fever, abdominal pain, nausea, vomiting, go to the emergency department for further evaluation needed.

## 2017-11-09 NOTE — ED Triage Notes (Signed)
Pt sts lower abd pain and cramping; pt denies urinary sx

## 2017-11-09 NOTE — ED Provider Notes (Signed)
[facility]    CSN: [id number] Arrival date & time: 11/09/17  1803     History   Chief Complaint Chief Complaint  Patient presents with  . Abdominal Pain    HPI Jean Glass is a 32 y.o. female.   32 year old female comes in for sudden onset of low abdominal cramping few hours ago.  States cramping is constant, worse with breathing, hard to walk due to the pain.  Denies nausea, vomiting, diarrhea.  Denies fever, chills, night sweats.  Denies urinary symptoms such as frequency, dysuria, hematuria.  Denies vaginal symptoms such as discharge, itching/pain, spotting.  LMP 10/25/2017.  Sexually active with one female partner, no condom use.  No other birth control use.  Has a history of C-section, denies any other abdominal surgeries.      Past Medical History:  Diagnosis Date  . Anemia   . Migraine     Patient Active Problem List   Diagnosis Date Noted  . OBESITY 02/04/2010  . FATIGUE 02/04/2010  . WEIGHT GAIN 02/04/2010    Past Surgical History:  Procedure Laterality Date  . CESAREAN SECTION    . NO PAST SURGERIES      OB History    Gravida Para Term Preterm AB Living   2 2 2     2    SAB TAB Ectopic Multiple Live Births           1       Home Medications    Prior to Admission medications   Medication Sig Start Date End Date Taking? Authorizing Provider  doxycycline (VIBRAMYCIN) 100 MG capsule Take 1 capsule (100 mg total) by mouth 2 (two) times daily for 14 days. 11/09/17 11/23/17  Belinda FisherYu, Sarahann Horrell V, PA-C  ferrous sulfate 325 (65 FE) MG tablet Take 1 tablet (325 mg total) by mouth daily with breakfast. 08/06/17   Benjiman CoreWiseman, Brittany D, PA-C  fluconazole (DIFLUCAN) 150 MG tablet Take 1 tablet (150 mg total) by mouth daily. Take second dose 72 hours later if symptoms still persists. 11/09/17   Cathie HoopsYu, Jiaire Rosebrook V, PA-C  metroNIDAZOLE (FLAGYL) 500 MG tablet Take 1 tablet (500 mg total) by mouth 2 (two) times daily for 14 days. 11/09/17 11/23/17  Belinda FisherYu, Virgil Lightner V, PA-C    Family  History Family History  Problem Relation Age of Onset  . Hypertension Mother   . Colon cancer Father   . Pulmonary embolism Father   . Stroke Maternal Grandmother   . Hypertension Maternal Grandmother   . Other Neg Hx     Social History Social History   Tobacco Use  . Smoking status: Never Smoker  . Smokeless tobacco: Never Used  Substance Use Topics  . Alcohol use: No  . Drug use: No     Allergies   Patient has no known allergies.   Review of Systems Review of Systems  Reason unable to perform ROS: See HPI as above.     Physical Exam Triage Vital Signs ED Triage Vitals [11/09/17 1837]  Enc Vitals Group     BP (!) 146/71     Pulse Rate 98     Resp 18     Temp 98.8 F (37.1 C)     Temp Source Oral     SpO2 100 %     Weight      Height      Head Circumference      Peak Flow      Pain Score 9  Pain Loc      Pain Edu?      Excl. in GC?    No data found.  Updated Vital Signs BP (!) 146/71 (BP Location: Right Arm)   Pulse 98   Temp 98.8 F (37.1 C) (Oral)   Resp 18   SpO2 100%   Physical Exam  Constitutional: She is oriented to person, place, and time. She appears well-developed and well-nourished. No distress.  HENT:  Head: Normocephalic and atraumatic.  Eyes: Conjunctivae are normal. Pupils are equal, round, and reactive to light.  Cardiovascular: Normal rate, regular rhythm and normal heart sounds. Exam reveals no gallop and no friction rub.  No murmur heard. Pulmonary/Chest: Effort normal and breath sounds normal. She has no wheezes. She has no rales.  Abdominal: Soft. Bowel sounds are normal. She exhibits no mass. There is no rebound and no CVA tenderness.  Bilateral low abdominal tenderness with guarding.  Genitourinary: There is no rash, tenderness or lesion on the right labia. There is no rash, tenderness or lesion on the left labia. Uterus is tender. Cervix exhibits motion tenderness and discharge. Right adnexum displays no mass and no  tenderness. Left adnexum displays no mass and no tenderness. No bleeding in the vagina. Vaginal discharge found.  Neurological: She is alert and oriented to person, place, and time.  Skin: Skin is warm and dry.  Psychiatric: She has a normal mood and affect. Her behavior is normal. Judgment normal.    UC Treatments / Results  Labs (all labs ordered are listed, but only abnormal results are displayed) Labs Reviewed  POCT URINALYSIS DIP (DEVICE)  POCT PREGNANCY, URINE  CERVICOVAGINAL ANCILLARY ONLY    EKG  EKG Interpretation None       Radiology No results found.  Procedures Procedures (including critical care time)  Medications Ordered in UC Medications  cefTRIAXone (ROCEPHIN) injection 250 mg (250 mg Intramuscular Given 11/09/17 2013)     Initial Impression / Assessment and Plan / UC Course  I have reviewed the triage vital signs and the nursing notes.  Pertinent labs & imaging results that were available during my care of the patient were reviewed by me and considered in my medical decision making (see chart for details).    Given history and exam, will treat for PID.  Rocephin 250 mg injection given in office today.  Start doxycycline and Flagyl as directed.  Patient to refrain from alcohol use while on Flagyl.  Cottage cheese like discharge on exam as well, so will cover for yeast with Diflucan. Strict return precautions given.  Patient expresses understanding and agrees to plan.  Final Clinical Impressions(s) / UC Diagnoses   Final diagnoses:  Pelvic pain in female    ED Discharge Orders        Ordered    doxycycline (VIBRAMYCIN) 100 MG capsule  2 times daily     11/09/17 2008    metroNIDAZOLE (FLAGYL) 500 MG tablet  2 times daily     11/09/17 2008    fluconazole (DIFLUCAN) 150 MG tablet  Daily     11/09/17 2009        Yehoshua Vitelli V, [place] 11/09/17 2045

## 2017-11-12 LAB — CERVICOVAGINAL ANCILLARY ONLY
BACTERIAL VAGINITIS: POSITIVE — AB
Candida vaginitis: NEGATIVE
Chlamydia: NEGATIVE
NEISSERIA GONORRHEA: NEGATIVE
TRICH (WINDOWPATH): NEGATIVE

## 2017-11-13 ENCOUNTER — Encounter: Payer: Self-pay | Admitting: Physician Assistant

## 2017-11-15 ENCOUNTER — Telehealth: Payer: Self-pay | Admitting: Physician Assistant

## 2017-11-15 NOTE — Telephone Encounter (Signed)
Patient contacted via phone regarding my chart message.  She did not answer.  Voicemail was left.  Explained that cervicovaginal ancillary only was pertaining to the vaginal swabs are collected when she went to the ER for pelvic pain.   it appears that she was prescribed Flagyl for this condition.  If she has any questions she can contact our office back.

## 2017-11-17 ENCOUNTER — Telehealth: Payer: Self-pay | Admitting: Physician Assistant

## 2017-11-17 NOTE — Telephone Encounter (Signed)
Pt. Is having additional symptoms not discussed during OV. Pt. Declines to provide the details of the symptoms. Wishes to be advised.  Please Advise   Best number: [phone]

## 2017-11-19 ENCOUNTER — Encounter: Payer: Self-pay | Admitting: Physician Assistant

## 2017-11-19 NOTE — Telephone Encounter (Signed)
Left message to give us a call back with some details so we can let Jean Glass know what is going on.

## 2017-11-25 ENCOUNTER — Encounter (HOSPITAL_COMMUNITY): Payer: Self-pay | Admitting: Emergency Medicine

## 2017-11-25 ENCOUNTER — Emergency Department (HOSPITAL_COMMUNITY)
Admission: EM | Admit: 2017-11-25 | Discharge: 2017-11-25 | Disposition: A | Discharge status: Home / Self Care | Payer: Self-pay | Attending: Emergency Medicine | Admitting: Emergency Medicine

## 2017-11-25 DIAGNOSIS — K0889 Other specified disorders of teeth and supporting structures: Secondary | ICD-10-CM | POA: Insufficient documentation

## 2017-11-25 DIAGNOSIS — Z79899 Other long term (current) drug therapy: Secondary | ICD-10-CM | POA: Insufficient documentation

## 2017-11-25 MED ORDER — PENICILLIN V POTASSIUM 500 MG PO TABS: 500.0000 mg | ORAL_TABLET | Freq: Four times a day (QID) | ORAL | 0 refills | Status: AC

## 2017-11-25 MED ORDER — BUPIVACAINE-EPINEPHRINE (PF) 0.5% -1:200000 IJ SOLN
1.8000 mL | Freq: Once | INTRAMUSCULAR | Status: AC
  Administered 2017-11-25: 1.8 mL
  Filled 2017-11-25: qty 1.8

## 2017-11-25 MED ORDER — LIDOCAINE VISCOUS 2 % MT SOLN: 15.0000 mL | OROMUCOSAL | 2 refills | Status: DC | PRN

## 2017-11-25 NOTE — ED Triage Notes (Signed)
Pt reports dental pain that is keeping her from eating or sleeping X3 days

## 2017-11-25 NOTE — ED Notes (Signed)
Pt complaint of dental pain, no abscess visible. PT able to swallow and talk in full complete sentences, airway clear. Pt able to manage secretions

## 2017-11-25 NOTE — ED Provider Notes (Signed)
[facility] Provider Note   CSN: [id number] Arrival date & time: 11/25/17  1934     History   Chief Complaint Chief Complaint  Patient presents with  . Dental Pain    HPI Jean Glass is a 32 y.o. female.  HPI   Jean Glass is a 32 y.o. female, presenting to the ED with right lower dental pain for the last 3 days.  Pain is 10/10, aching, radiating into the right side of the face.  Endorses subjective fever and nausea.  She has taken 1 dose of ibuprofen and 1 dose of Tylenol.  Denies difficulty breathing or swallowing, facial swelling, drooling, trauma, or any other complaints.       Past Medical History:  Diagnosis Date  . Anemia   . Migraine     Patient Active Problem List   Diagnosis Date Noted  . OBESITY 02/04/2010  . FATIGUE 02/04/2010  . WEIGHT GAIN 02/04/2010    Past Surgical History:  Procedure Laterality Date  . CESAREAN SECTION    . NO PAST SURGERIES       OB History    Gravida  2   Para  2   Term  2   Preterm      AB      Living  2     SAB      TAB      Ectopic      Multiple      Live Births  1            Home Medications    Prior to Admission medications   Medication Sig Start Date End Date Taking? Authorizing Provider  ferrous sulfate 325 (65 FE) MG tablet Take 1 tablet (325 mg total) by mouth daily with breakfast. 08/06/17   Benjiman CoreWiseman, Brittany D, PA-C  fluconazole (DIFLUCAN) 150 MG tablet Take 1 tablet (150 mg total) by mouth daily. Take second dose 72 hours later if symptoms still persists. 11/09/17   Cathie HoopsYu, Amy V, PA-C  lidocaine (XYLOCAINE) 2 % solution Use as directed 15 mLs in the mouth or throat as needed for mouth pain. 11/25/17   Joy, Shawn C, PA-C  penicillin v potassium (VEETID) 500 MG tablet Take 1 tablet (500 mg total) by mouth 4 (four) times daily for 7 days. 11/25/17 12/02/17  Anselm PancoastJoy, Shawn C, PA-C    Family History Family History  Problem Relation Age of Onset    . Hypertension Mother   . Colon cancer Father   . Pulmonary embolism Father   . Stroke Maternal Grandmother   . Hypertension Maternal Grandmother   . Other Neg Hx     Social History Social History   Tobacco Use  . Smoking status: Never Smoker  . Smokeless tobacco: Never Used  Substance Use Topics  . Alcohol use: No  . Drug use: No     Allergies   Patient has no known allergies.   Review of Systems Review of Systems  Constitutional: Positive for fever (subjective).  HENT: Positive for dental problem. Negative for drooling, facial swelling, trouble swallowing and voice change.   Respiratory: Negative for shortness of breath.   Gastrointestinal: Positive for nausea and vomiting.  Musculoskeletal: Negative for neck pain.     Physical Exam Updated Vital Signs BP 134/81 (BP Location: Right Arm)   Pulse 82   Temp 98.7 F (37.1 C) (Oral)   Resp 16   Ht 5\' 3"  (1.6 m)   Wt  81.6 kg (180 lb)   SpO2 100%   BMI 31.89 kg/m   Physical Exam  Constitutional: She appears well-developed and well-nourished. No distress.  HENT:  Head: Normocephalic and atraumatic.  Tenderness in the region of the right rearmost mandibular molar.  Dentition appears to be intact and stable.  No noted area of swelling or fluctuance.  No trismus.  Mouth opening to at least 3 finger widths.  Handles oral secretions without difficulty.  No noted facial swelling.  No swelling or tenderness to the submental or submandibular regions.  No swelling or tenderness into the soft tissues of the neck.  Eyes: Conjunctivae are normal.  Neck: Normal range of motion. Neck supple.  Cardiovascular: Normal rate and regular rhythm.  Pulmonary/Chest: Effort normal.  Lymphadenopathy:    She has no cervical adenopathy.  Neurological: She is alert.  Skin: Skin is warm and dry. She is not diaphoretic. No pallor.  Psychiatric: She has a normal mood and affect. Her behavior is normal.  Nursing note and vitals  reviewed.    ED Treatments / Results  Labs (all labs ordered are listed, but only abnormal results are displayed) Labs Reviewed - No data to display  EKG None  Radiology No results found.  Procedures Dental Block Date/Time: 11/25/2017 8:50 PM Performed by: Anselm Pancoast, PA-C Authorized by: Anselm Pancoast, PA-C   Consent:    Consent obtained:  Verbal   Consent given by:  Patient   Risks discussed:  Unsuccessful block, swelling and pain Indications:    Indications: dental pain   Location:    Block type:  Inferior alveolar   Laterality:  Right Procedure details (see MAR for exact dosages):    Syringe type:  Controlled syringe   Needle gauge:  27 G   Anesthetic injected:  Bupivacaine 0.5% WITH epi   Injection procedure:  Anatomic landmarks identified, anatomic landmarks palpated, introduced needle, negative aspiration for blood and incremental injection Post-procedure details:    Outcome:  Pain relieved   Patient tolerance of procedure:  Tolerated well, no immediate complications   (including critical care time)  Medications Ordered in ED Medications  bupivacaine-epinephrine (MARCAINE W/ EPI) 0.5% -1:200000 injection 1.8 mL (1.8 mLs Infiltration Given 11/25/17 2053)     Initial Impression / Assessment and Plan / ED Course  I have reviewed the triage vital signs and the nursing notes.  Pertinent labs & imaging results that were available during my care of the patient were reviewed by me and considered in my medical decision making (see chart for details).     Patient presents with right lower dental pain.  Nontoxic-appearing, afebrile, not tachycardic.  Low suspicion for sepsis or Ludwig's angioedema.  Dental follow-up recommended.  Resources given. The patient was given instructions for home care as well as return precautions. Patient voices understanding of these instructions, accepts the plan, and is comfortable with discharge.     Final Clinical Impressions(s) /  ED Diagnoses   Final diagnoses:  Pain, dental    ED Discharge Orders        Ordered    lidocaine (XYLOCAINE) 2 % solution  As needed     11/25/17 2052    penicillin v potassium (VEETID) 500 MG tablet  4 times daily     11/25/17 2052       Concepcion Living 11/25/17 2116    Alvira Monday, MD 11/26/17 1401

## 2017-11-25 NOTE — Discharge Instructions (Addendum)
°  Dental Pain °You have been seen today for dental pain. You should follow up with a dentist as soon as possible. This problem will not resolve on its own without the care of a dentist. Use ibuprofen or naproxen for pain. Use the viscous lidocaine for mouth pain. Swish with the lidocaine and spit it out. Do not swallow it.  ° °Please take all of your antibiotics until finished!   You may develop abdominal discomfort or diarrhea from the antibiotic.  You may help offset this with probiotics which you can buy or get in yogurt. Do not eat or take the probiotics until 2 hours after your antibiotic.  ° °Dental Resource Guide ° °[facility] °[address], [address] °[place], [place] [address] °[phone] ° °[facility] °[address] °[place], [place] [address] °[phone] ° °[facility] °[address] °[place], [place] [address] °[phone]. [phone] ° °[facility] °[address] °[place], [place] [address] °[phone] ° °[facility] °[address] °[place], [place] [address] °[phone] ° °[employer] °Www.[facility]istry.[employer].edu/patientcare/studentclinics/becomepatient ° °[employer] °[address] °[place], [place] [address] °[phone] ° °Website for free, low-income, or sliding scale dental services in [place]: °www.freedental.us ° °To find a dentist in [place] and surrounding areas: °www.ncdental.org/for-the-public/find-a-dentist ° °[facility] °http://www.ncdental.org/meetings-events/[place] ° °[place] [facility] °https://dma.ncdhhs.gov/find-a-doctor/medicaid-dental-providers ° °

## 2018-11-22 ENCOUNTER — Encounter (HOSPITAL_COMMUNITY): Payer: Self-pay | Admitting: Emergency Medicine

## 2018-11-22 ENCOUNTER — Ambulatory Visit (HOSPITAL_COMMUNITY)
Admission: EM | Admit: 2018-11-22 | Discharge: 2018-11-22 | Disposition: A | Discharge status: Home / Self Care | Payer: Self-pay | Attending: Family Medicine | Admitting: Family Medicine

## 2018-11-22 ENCOUNTER — Other Ambulatory Visit: Payer: Self-pay

## 2018-11-22 DIAGNOSIS — H66002 Acute suppurative otitis media without spontaneous rupture of ear drum, left ear: Secondary | ICD-10-CM

## 2018-11-22 MED ORDER — AMOXICILLIN-POT CLAVULANATE 875-125 MG PO TABS: 1.0000 | ORAL_TABLET | Freq: Two times a day (BID) | ORAL | 0 refills | Status: AC

## 2018-11-22 MED ORDER — FLUTICASONE PROPIONATE 50 MCG/ACT NA SUSP: 1.0000 | Freq: Every day | NASAL | 2 refills | Status: DC

## 2018-11-22 NOTE — ED Notes (Signed)
Pt discharged by provider.

## 2018-11-22 NOTE — ED Triage Notes (Signed)
Per pt she has been having ear fullness in her left ear. She can hear but it is faint. No pain. No fevers

## 2018-11-22 NOTE — Discharge Instructions (Signed)
Daily nasal spray.  Tylenol and/or ibuprofen as needed for pain or fevers.  Complete course of antibiotics.  If symptoms worsen or do not improve in the next week to return to be seen or to follow up with your PCP.

## 2018-11-23 NOTE — ED Provider Notes (Signed)
[facility]    CSN: [id number] Arrival date & time: 11/22/18  1715     History   Chief Complaint Chief Complaint  Patient presents with  . Ear Fullness    HPI Jean Glass is a 33 y.o. female.   [name] presents with complaints of left ear "fullness" which started 4-5 days ago. Not necessarily painful. No drainage or discharge. Has tried OTC ear drops which hasn't helped. Mild cough. No sore throat. No gi/gu complaints. No abdominal pain or rash. No known ill contacts. Without contributing medical history.      ROS per HPI, negative if not otherwise mentioned.      Past Medical History:  Diagnosis Date  . Anemia   . Migraine     Patient Active Problem List   Diagnosis Date Noted  . OBESITY 02/04/2010  . FATIGUE 02/04/2010  . WEIGHT GAIN 02/04/2010    Past Surgical History:  Procedure Laterality Date  . CESAREAN SECTION    . NO PAST SURGERIES      OB History    Gravida  2   Para  2   Term  2   Preterm      AB      Living  2     SAB      TAB      Ectopic      Multiple      Live Births  1            Home Medications    Prior to Admission medications   Medication Sig Start Date End Date Taking? Authorizing Provider  amoxicillin-clavulanate (AUGMENTIN) 875-125 MG tablet Take 1 tablet by mouth every 12 (twelve) hours for 5 days. 11/22/18 11/27/18  Georgetta Haber, NP  ferrous sulfate 325 (65 FE) MG tablet Take 1 tablet (325 mg total) by mouth daily with breakfast. 08/06/17   Benjiman Core D, PA-C  fluconazole (DIFLUCAN) 150 MG tablet Take 1 tablet (150 mg total) by mouth daily. Take second dose 72 hours later if symptoms still persists. 11/09/17   Cathie Hoops, Amy V, PA-C  fluticasone (FLONASE) 50 MCG/ACT nasal spray Place 1 spray into both nostrils daily. 11/22/18   Georgetta Haber, NP  lidocaine (XYLOCAINE) 2 % solution Use as directed 15 mLs in the mouth or throat as needed for mouth pain. 11/25/17   Joy, Hillard Danker, PA-C     Family History Family History  Problem Relation Age of Onset  . Hypertension Mother   . Colon cancer Father   . Pulmonary embolism Father   . Stroke Maternal Grandmother   . Hypertension Maternal Grandmother   . Other Neg Hx     Social History Social History   Tobacco Use  . Smoking status: Never Smoker  . Smokeless tobacco: Never Used  Substance Use Topics  . Alcohol use: No  . Drug use: No     Allergies   Patient has no known allergies.   Review of Systems Review of Systems   Physical Exam Triage Vital Signs ED Triage Vitals  Enc Vitals Group     BP 11/22/18 1740 109/65     Pulse Rate 11/22/18 1740 65     Resp 11/22/18 1740 16     Temp 11/22/18 1740 98.5 F (36.9 C)     Temp Source 11/22/18 1740 Temporal     SpO2 11/22/18 1740 100 %     Weight 11/22/18 1741 190 lb (86.2 kg)     Height  11/22/18 1741 5\' 3"  (1.6 m)     Head Circumference --      Peak Flow --      Pain Score 11/22/18 1741 0     Pain Loc --      Pain Edu? --      Excl. in GC? --    No data found.  Updated Vital Signs BP 109/65 (BP Location: Right Arm)   Pulse 65   Temp 98.5 F (36.9 C) (Temporal)   Resp 16   Ht 5\' 3"  (1.6 m)   Wt 190 lb (86.2 kg)   LMP 11/03/2018 (Exact Date)   SpO2 100%   BMI 33.66 kg/m    Physical Exam Constitutional:      General: She is not in acute distress.    Appearance: She is well-developed.  HENT:     Head: Normocephalic and atraumatic.     Right Ear: Tympanic membrane, ear canal and external ear normal.     Left Ear: Ear canal and external ear normal. A middle ear effusion is present. Tympanic membrane is erythematous.     Nose: Nose normal.     Mouth/Throat:     Pharynx: Uvula midline.     Tonsils: No tonsillar exudate.  Eyes:     Conjunctiva/sclera: Conjunctivae normal.     Pupils: Pupils are equal, round, and reactive to light.  Cardiovascular:     Rate and Rhythm: Normal rate and regular rhythm.     Heart sounds: Normal heart sounds.   Pulmonary:     Effort: Pulmonary effort is normal.     Breath sounds: Normal breath sounds.  Skin:    General: Skin is warm and dry.  Neurological:     Mental Status: She is alert and oriented to person, place, and time.      UC Treatments / Results  Labs (all labs ordered are listed, but only abnormal results are displayed) Labs Reviewed - No data to display  EKG None  Radiology No results found.  Procedures Procedures (including critical care time)  Medications Ordered in UC Medications - No data to display  Initial Impression / Assessment and Plan / UC Course  I have reviewed the triage vital signs and the nursing notes.  Pertinent labs & imaging results that were available during my care of the patient were reviewed by me and considered in my medical decision making (see chart for details).     AOM  On exam with antibiotics provided. Return precautions provided. If symptoms worsen or do not improve in the next week to return to be seen or to follow up with PCP.  Patient verbalized understanding and agreeable to plan.   Final Clinical Impressions(s) / UC Diagnoses   Final diagnoses:  Non-recurrent acute suppurative otitis media of left ear without spontaneous rupture of tympanic membrane     Discharge Instructions     Daily nasal spray.  Tylenol and/or ibuprofen as needed for pain or fevers.  Complete course of antibiotics.  If symptoms worsen or do not improve in the next week to return to be seen or to follow up with your PCP.     ED Prescriptions    Medication Sig Dispense Auth. Provider   fluticasone (FLONASE) 50 MCG/ACT nasal spray Place 1 spray into both nostrils daily. 16 g Linus Mako B, NP   amoxicillin-clavulanate (AUGMENTIN) 875-125 MG tablet Take 1 tablet by mouth every 12 (twelve) hours for 5 days. 10 tablet Georgetta Haber, NP  Controlled Substance Prescriptions  Controlled Substance Registry consulted? Not Applicable   Georgetta Haber, NP 11/23/18 1023

## 2019-07-09 ENCOUNTER — Encounter (INDEPENDENT_AMBULATORY_CARE_PROVIDER_SITE_OTHER): Payer: Self-pay | Admitting: Physician Assistant

## 2019-08-31 ENCOUNTER — Encounter (HOSPITAL_COMMUNITY): Payer: Self-pay

## 2019-08-31 ENCOUNTER — Ambulatory Visit (HOSPITAL_COMMUNITY)
Admission: EM | Admit: 2019-08-31 | Discharge: 2019-08-31 | Disposition: A | Discharge status: Home / Self Care | Payer: Self-pay | Attending: Family Medicine | Admitting: Family Medicine

## 2019-08-31 ENCOUNTER — Other Ambulatory Visit: Payer: Self-pay

## 2019-08-31 DIAGNOSIS — N939 Abnormal uterine and vaginal bleeding, unspecified: Secondary | ICD-10-CM

## 2019-08-31 DIAGNOSIS — Z3202 Encounter for pregnancy test, result negative: Secondary | ICD-10-CM

## 2019-08-31 LAB — POCT PREGNANCY, URINE: Preg Test, Ur: NEGATIVE

## 2019-08-31 LAB — POC URINE PREG, ED
Preg Test, Ur: NEGATIVE
Preg Test, Ur: NEGATIVE

## 2019-08-31 MED ORDER — NORETHINDRONE ACETATE 5 MG PO TABS: 5.0000 mg | ORAL_TABLET | Freq: Every day | ORAL | 0 refills | Status: DC

## 2019-08-31 NOTE — ED Provider Notes (Signed)
  [facility]    CSN: [id number] Arrival date & time: 08/31/19  1021  Chief Complaint  Patient presents with  . Abnormal Vaginal Bleeding    Subjective: Patient is a 33 y.o. female here for vag bleeding.  Cycles are normally 5-6 d in duration. This one is still going for the past 10-11 days. No pain, d/c, urinary complaints, fevers, abd pain outside of normal cycle, injury. Not passing products of conception. Not on contraception. No recent med changes or unexplained wt changes.    ROS: GU: As noted in HPI  Past Medical History:  Diagnosis Date  . Anemia   . Migraine     Objective: BP 129/77 (BP Location: Left Arm)   Pulse 90   Temp 98.5 F (36.9 C) (Oral)   Resp 18   LMP 08/21/2019   SpO2 98%  General: Awake, appears stated age Abd: BS+, S, NT, ND, no masses or organomegaly Heart: RRR, no murmurs Lungs: CTAB, no rales, wheezes or rhonchi. No accessory muscle use GU: Examined in presence of female chaperone: no ext lesions; there vaginal canal is moist, pink and without lesions. Physio discharge mixed with scant amount of blood noted and appears to be coming from os. No signs of trauma or lesions noted.  Psych: Age appropriate judgment and insight, normal affect and mood  Final Clinical Impressions(s) / UC Diagnoses   Final diagnoses:  Abnormal uterine bleeding (AUB)     Discharge Instructions     Your pregnancy test is negative.  Your exam does not show any injury or lesion in the canal.   If things do not improve after this progesterone challenge, I would recommend seeing your PCP or GYN for further management.    ED Prescriptions    Medication Sig Dispense Auth. Provider   norethindrone (AYGESTIN) 5 MG tablet Take 1 tablet (5 mg total) by mouth daily for 10 days. 10 tablet Shelda Pal, DO       Riki Sheer [place], [place] 08/31/19 1232

## 2019-08-31 NOTE — ED Triage Notes (Signed)
Pt presents with abnormal vaginal bleeding past the length of her normal period.

## 2019-08-31 NOTE — Discharge Instructions (Addendum)
Your pregnancy test is negative.  Your exam does not show any injury or lesion in the canal.   If things do not improve after this progesterone challenge, I would recommend seeing your PCP or GYN for further management.

## 2019-10-23 ENCOUNTER — Ambulatory Visit (HOSPITAL_COMMUNITY)
Admission: EM | Admit: 2019-10-23 | Discharge: 2019-10-23 | Disposition: A | Discharge status: Home / Self Care | Payer: Medicaid Other | Attending: Family Medicine | Admitting: Family Medicine

## 2019-10-23 ENCOUNTER — Other Ambulatory Visit: Payer: Self-pay

## 2019-10-23 ENCOUNTER — Encounter (HOSPITAL_COMMUNITY): Payer: Self-pay

## 2019-10-23 DIAGNOSIS — N939 Abnormal uterine and vaginal bleeding, unspecified: Secondary | ICD-10-CM

## 2019-10-23 DIAGNOSIS — Z3202 Encounter for pregnancy test, result negative: Secondary | ICD-10-CM

## 2019-10-23 MED ORDER — MEGESTROL ACETATE 40 MG PO TABS: 40.0000 mg | ORAL_TABLET | Freq: Every day | ORAL | 0 refills | Status: AC

## 2019-10-23 NOTE — ED Provider Notes (Signed)
[facility] CARE CENTER   [id number] 10/23/19 Arrival Time: 1643  ASSESSMENT & PLAN:  1. Abnormal vaginal bleeding     Benign abdominal exam. VSS. UPT negative. See AVS for d/c information.  Begin: Meds ordered this encounter  Medications  . megestrol (MEGACE) 40 MG tablet    Sig: Take 1 tablet (40 mg total) by mouth daily for 14 days.    Dispense:  14 tablet    Refill:  0   Recommend: Follow-up Information    Schedule an appointment as soon as possible for a visit  with [facility] for [facility].   Specialty: Obstetrics and Gynecology Contact information: [address] [place] [address], [address] [id number] mc [place] [address] [phone]       [facility] EMERGENCY DEPARTMENT.   Specialty: Emergency Medicine Why: If symptoms worsen in any way. Contact information: [address] [id number] mc [place] [address] [phone]           Reviewed expectations re: course of current medical issues. Questions answered. Outlined signs and symptoms indicating need for more acute intervention. Patient verbalized understanding. After Visit Summary given.   SUBJECTIVE: History from: patient. Jean Glass is a 34 y.o. female who presents with complaint of fairly persistent vaginal bleeding for the past two weeks with blood clots. Gradual onset. No h/o similar. Jean Glass has regular menstrual periods until current bleeding started. Reports changing pad and tampon approx q 2-3 hours; usu saturated. No headaches. Very mild and transient lightheadedness; infrequent. None currently. OTC treatment: none. No new medications. Not on birth control.  No LMP recorded.   Past Surgical History:  Procedure Laterality Date  . CESAREAN SECTION    . NO PAST SURGERIES       OBJECTIVE:  Vitals:   10/23/19 1652  BP: 123/81  Pulse: 96  Resp: 17  Temp: 99.6 F (37.6 C)  TempSrc: Oral  SpO2: 99%      General appearance: alert, oriented, no acute distress HEENT: [place]; AT Lungs: unlabored respirations Abdomen: soft; without distention; no specific tenderness to palpation; without guarding or rebound tenderness Back: without reported CVA tenderness; FROM at waist Extremities: without LE edema; symmetrical; without gross deformities Skin: warm and dry Neurologic: normal gait Psychological: alert and cooperative; normal mood and affect  Labs: Results for orders placed or performed during the hospital encounter of 08/31/19  POC urine pregnancy  Result Value Ref Range   Preg Test, Ur NEGATIVE NEGATIVE  Pregnancy, urine POC  Result Value Ref Range   Preg Test, Ur NEGATIVE NEGATIVE  POC urine preg, ED (not at [facility])  Result Value Ref Range   Preg Test, Ur NEGATIVE NEGATIVE   Labs Reviewed  POC URINE PREG, ED   No Known Allergies                                             Past Medical History:  Diagnosis Date  . Anemia   . Migraine     Social History   Socioeconomic History  . Marital status: Significant Other    Spouse name: Not on file  . Number of children: 2  . Years of education: College  . Highest education level: Associate degree: academic program  Occupational History  . Occupation: Health Concierge     Comment: [employer]   Tobacco Use  . Smoking status: Never  Smoker  . Smokeless tobacco: Never Used  Substance and Sexual Activity  . Alcohol use: No  . Drug use: No  . Sexual activity: Yes    Partners: Male    Birth control/protection: None  Other Topics Concern  . Not on file  Social History Narrative   Pt is from [place]. Patient lives at home with 2 children (ages 59 and 69).      Diet: She loves fruits, vegetables, and seafood. Loves cheese and milk.   Drinks sweet tea and juices. Does not drink a lot of water. Does not take a daily multivitamin daily.      Exercise: Walks daily and stretches a few times a week.          Social Determinants of Health    Financial Resource Strain:   . Difficulty of Paying Living Expenses: Not on file  Food Insecurity:   . Worried About Charity fundraiser in the Last Year: Not on file  . Ran Out of Food in the Last Year: Not on file  Transportation Needs:   . Lack of Transportation (Medical): Not on file  . Lack of Transportation (Non-Medical): Not on file  Physical Activity:   . Days of Exercise per Week: Not on file  . Minutes of Exercise per Session: Not on file  Stress:   . Feeling of Stress : Not on file  Social Connections:   . Frequency of Communication with Friends and Family: Not on file  . Frequency of Social Gatherings with Friends and Family: Not on file  . Attends Religious Services: Not on file  . Active Member of Clubs or Organizations: Not on file  . Attends Archivist Meetings: Not on file  . Marital Status: Not on file  Intimate Partner Violence:   . Fear of Current or Ex-Partner: Not on file  . Emotionally Abused: Not on file  . Physically Abused: Not on file  . Sexually Abused: Not on file    Family History  Problem Relation Age of Onset  . Hypertension Mother   . Colon cancer Father   . Pulmonary embolism Father   . Stroke Maternal Grandmother   . Hypertension Maternal Grandmother   . Other Neg Hx      Jean Kick, MD 10/23/19 [phone]

## 2019-10-23 NOTE — ED Triage Notes (Signed)
Patient presents to Urgent Care with complaints of vaginal bleeding for almost two weeks and various sized blood clots since a week ago. Patient reports she is not on birth control of any kind, admits there is a chance she could have been/ could be pregnant. Pt denies pain throughout the bleeding.

## 2019-10-27 ENCOUNTER — Encounter: Payer: Medicaid Other | Admitting: Family Medicine

## 2019-11-28 ENCOUNTER — Encounter: Payer: Medicaid Other | Admitting: Family Medicine

## 2020-02-03 ENCOUNTER — Encounter: Payer: Self-pay | Admitting: Family Medicine

## 2020-02-03 ENCOUNTER — Ambulatory Visit (INDEPENDENT_AMBULATORY_CARE_PROVIDER_SITE_OTHER): Payer: Self-pay | Admitting: Family Medicine

## 2020-02-03 ENCOUNTER — Other Ambulatory Visit (HOSPITAL_COMMUNITY)
Admission: RE | Admit: 2020-02-03 | Discharge: 2020-02-03 | Disposition: A | Discharge status: Home / Self Care | Payer: Medicaid Other | Source: Ambulatory Visit | Attending: Family Medicine | Admitting: Family Medicine

## 2020-02-03 ENCOUNTER — Other Ambulatory Visit: Payer: Self-pay

## 2020-02-03 VITALS — BP 117/74 | HR 91 | Ht 64.0 in | Wt 190.2 lb

## 2020-02-03 DIAGNOSIS — D500 Iron deficiency anemia secondary to blood loss (chronic): Secondary | ICD-10-CM

## 2020-02-03 DIAGNOSIS — N920 Excessive and frequent menstruation with regular cycle: Secondary | ICD-10-CM | POA: Diagnosis present

## 2020-02-03 NOTE — Progress Notes (Signed)
GYNECOLOGY OFFICE VISIT NOTE  History:   Jean Glass is a 34 y.o. [phone] here today for abnormal/heavy menstrual bleeding.   Menarche: 34 yo Up until 2 months ago, normal monthly periods lasting about 5 days. Reports periods have always been heavier but in the last 2 months she has passed some clots and had sharp pains and was concerned for a miscarriage and went to ED but pregnancy tests always negative. Uses pads and tampons and describes using about 4-5 large overnight pads per day. She has been trying to conceive for past three years. She has been having unprotected sex regularly for three years. She stopped taking Depo in 2017. In last two months has not noticed weight change, palpitations, skin changes, hair loss, mood irregularities. Sometimes periods accompanied by sharp abdominal pain in pelvic region.   Past Medical History:  Diagnosis Date  . Anemia   . Migraine     Past Surgical History:  Procedure Laterality Date  . CESAREAN SECTION    . NO PAST SURGERIES      The following portions of the patient's history were reviewed and updated as appropriate: allergies, current medications, past family history, past medical history, past social history, past surgical history and problem list.   Health Maintenance:  Normal pap Nov 2018.  Review of Systems:  Pertinent items noted in HPI and remainder of comprehensive ROS otherwise negative.  Physical Exam:  BP 117/74   Pulse 91   Ht 5\' 4"  (1.626 m)   Wt 190 lb 3.2 oz (86.3 kg)   LMP 01/29/2020   BMI 32.65 kg/m  CONSTITUTIONAL: Well-developed, well-nourished female in no acute distress.  HEENT:  Normocephalic, atraumatic. External right and left ear normal. No scleral icterus.  NECK: Normal range of motion, supple, no masses noted on observation SKIN: No rash noted. Not diaphoretic. No erythema. No pallor. MUSCULOSKELETAL: Normal range of motion. No edema noted. NEUROLOGIC: Alert and oriented to person, place, and  time. Normal muscle tone coordination. No cranial nerve deficit noted. PSYCHIATRIC: Normal mood and affect. Normal behavior. Normal judgment and thought content. CARDIOVASCULAR: Normal heart rate noted RESPIRATORY: Effort and breath sounds normal, no problems with respiration noted ABDOMEN: No masses noted. No other overt distention noted.   PELVIC: Normal appearing external genitalia; normal urethral meatus; normal appearing vaginal mucosa and cervix.  No abnormal discharge noted. Moderately heavy uterine bleeding noted.  Normal uterine size, no other palpable masses, no uterine or adnexal tenderness. Performed in the presence of a chaperone  Labs and Imaging No results found for this or any previous visit (from the past 168 hour(s)). No results found.    Assessment and Plan:  Hollis was seen today for abnormal vaginal bleeding.  Diagnoses and all orders for this visit:  Menorrhagia with regular cycle -     Broad differential: hormonal, dysfunctional ovaries, fibroids/polyps, cancer, adenomyosis, bleeding disorder, among others -  Desires pregnancy. Recommended sperm workup for partner as different partner from her two children.  -  Most likely polyp/fibroid. Workup as below.  - CBC -     Thyroid Panel With TSH -     Ladean Raya PELVIS (TRANSABDOMINAL ONLY); Future -     Iron, TIBC and Ferritin Panel -     Cytology - PAP( [place]) -     Wet prep, genital  Routine preventative health maintenance measures emphasized. Please refer to After Visit Summary for other counseling recommendations.   No follow-ups on file.    Total face-to-face  time with patient: 20 minutes.  Over 50% of encounter was spent on counseling and coordination of care.  Barrington Ellison, MD [facility] Family Medicine Fellow, [facility] for [employer], [place]

## 2020-02-03 NOTE — Addendum Note (Signed)
Addended by: Gerome Apley on: 02/03/2020 10:46 AM   Modules accepted: Orders

## 2020-02-03 NOTE — Progress Notes (Signed)
Here for abnormal bleeding, states lasting longer than usual- before 5 days, now 8-9.Last month had 2 periods . Legrand Como

## 2020-02-04 ENCOUNTER — Other Ambulatory Visit: Payer: Self-pay | Admitting: Family Medicine

## 2020-02-04 DIAGNOSIS — N920 Excessive and frequent menstruation with regular cycle: Secondary | ICD-10-CM

## 2020-02-04 LAB — CERVICOVAGINAL ANCILLARY ONLY
Bacterial Vaginitis (gardnerella): POSITIVE — AB
Candida Glabrata: NEGATIVE
Candida Vaginitis: NEGATIVE
Chlamydia: NEGATIVE
Comment: NEGATIVE
Comment: NEGATIVE
Comment: NEGATIVE
Comment: NEGATIVE
Comment: NEGATIVE
Comment: NORMAL
Neisseria Gonorrhea: NEGATIVE
Trichomonas: NEGATIVE

## 2020-02-04 LAB — THYROID PANEL WITH TSH
Free Thyroxine Index: >3.6 (ref 1.2–4.9)
T3 Uptake Ratio: >56 % — ABNORMAL HIGH (ref 24–39)
T4, Total: 6.4 ug/dL (ref 4.5–12.0)
TSH: 2.92 u[IU]/mL (ref 0.450–4.500)

## 2020-02-04 LAB — CBC
Hematocrit: 28 % — ABNORMAL LOW (ref 34.0–46.6)
Hemoglobin: 6.6 g/dL — CL (ref 11.1–15.9)
MCH: 14.4 pg — ABNORMAL LOW (ref 26.6–33.0)
MCHC: 23.6 g/dL — CL (ref 31.5–35.7)
MCV: 61 fL — ABNORMAL LOW (ref 79–97)
Platelets: 505 10*3/uL — ABNORMAL HIGH (ref 150–450)
RBC: 4.59 x10E6/uL (ref 3.77–5.28)
RDW: 23 % — ABNORMAL HIGH (ref 11.7–15.4)
WBC: 9.2 10*3/uL (ref 3.4–10.8)

## 2020-02-04 LAB — IRON,TIBC AND FERRITIN PANEL
Ferritin: 4 ng/mL — ABNORMAL LOW (ref 15–150)
Iron Saturation: 2 % — CL (ref 15–55)
Iron: 10 ug/dL — ABNORMAL LOW (ref 27–159)
Total Iron Binding Capacity: 446 ug/dL (ref 250–450)
UIBC: 436 ug/dL — ABNORMAL HIGH (ref 131–425)

## 2020-02-04 MED ORDER — FERROUS SULFATE 325 (65 FE) MG PO TABS: 325.0000 mg | ORAL_TABLET | Freq: Every day | ORAL | 1 refills | Status: AC

## 2020-02-04 NOTE — Addendum Note (Signed)
Addended by: Jerilynn Birkenhead on: 02/04/2020 09:39 AM   Modules accepted: Orders

## 2020-02-05 LAB — CYTOLOGY - PAP
Comment: NEGATIVE
Diagnosis: NEGATIVE
High risk HPV: NEGATIVE

## 2020-02-06 MED ORDER — MEGESTROL ACETATE 40 MG PO TABS: ORAL_TABLET | ORAL | 3 refills | Status: DC

## 2020-02-06 NOTE — Addendum Note (Signed)
Addended by: Jerilynn Birkenhead on: 02/06/2020 04:12 PM   Modules accepted: Orders

## 2020-02-12 ENCOUNTER — Ambulatory Visit: Admission: RE | Admit: 2020-02-12 | Payer: Medicaid Other | Source: Ambulatory Visit

## 2020-03-04 DIAGNOSIS — Z419 Encounter for procedure for purposes other than remedying health state, unspecified: Secondary | ICD-10-CM | POA: Diagnosis not present

## 2020-03-28 ENCOUNTER — Encounter (HOSPITAL_COMMUNITY): Payer: Self-pay | Admitting: Emergency Medicine

## 2020-03-28 ENCOUNTER — Other Ambulatory Visit: Payer: Self-pay

## 2020-03-28 ENCOUNTER — Emergency Department (HOSPITAL_COMMUNITY)
Admission: EM | Admit: 2020-03-28 | Discharge: 2020-03-28 | Disposition: A | Discharge status: Home / Self Care | Payer: Medicaid Other | Attending: Emergency Medicine | Admitting: Emergency Medicine

## 2020-03-28 DIAGNOSIS — K0889 Other specified disorders of teeth and supporting structures: Secondary | ICD-10-CM | POA: Diagnosis not present

## 2020-03-28 DIAGNOSIS — Z800 Family history of malignant neoplasm of digestive organs: Secondary | ICD-10-CM | POA: Diagnosis not present

## 2020-03-28 DIAGNOSIS — Z79899 Other long term (current) drug therapy: Secondary | ICD-10-CM | POA: Diagnosis not present

## 2020-03-28 MED ORDER — AMOXICILLIN 500 MG PO CAPS
1000.0000 mg | ORAL_CAPSULE | Freq: Once | ORAL | Status: AC
  Administered 2020-03-28: 1000 mg via ORAL
  Filled 2020-03-28: qty 2

## 2020-03-28 MED ORDER — AMOXICILLIN 500 MG PO CAPS: 500.0000 mg | ORAL_CAPSULE | Freq: Three times a day (TID) | ORAL | 0 refills | Status: DC

## 2020-03-28 MED ORDER — KETOROLAC TROMETHAMINE 60 MG/2ML IM SOLN
60.0000 mg | Freq: Once | INTRAMUSCULAR | Status: AC
  Administered 2020-03-28: 60 mg via INTRAMUSCULAR
  Filled 2020-03-28: qty 2

## 2020-03-28 MED ORDER — NAPROXEN 500 MG PO TABS: 500.0000 mg | ORAL_TABLET | Freq: Two times a day (BID) | ORAL | 0 refills | Status: DC

## 2020-03-28 NOTE — Discharge Instructions (Addendum)
Thank you for allowing me to care for you today. Please return to the emergency department if you have new or worsening symptoms. Take your medications as instructed.  ° °

## 2020-03-28 NOTE — ED Provider Notes (Signed)
[facility] EMERGENCY DEPARTMENT Provider Note   CSN: [id number] Arrival date & time: 03/28/20  3716     History Chief Complaint  Patient presents with  . Dental Pain    Jean Glass is a 34 y.o. female.  Patient is a 34 year old female with past medical history of migraine headaches presenting to the emergency department for dental pain.  She reports that has been off and on for some time but the last couple days has been more constant.  Reports that she feels pain in the left side in the upper and lower gums.  Denies any trouble breathing or swallowing.  Reports that she has felt feverish.  Reports that she has a dental appointment tomorrow.  Has not tried anything for relief.        Past Medical History:  Diagnosis Date  . Anemia   . Migraine     Patient Active Problem List   Diagnosis Date Noted  . OBESITY 02/04/2010  . FATIGUE 02/04/2010  . WEIGHT GAIN 02/04/2010    Past Surgical History:  Procedure Laterality Date  . CESAREAN SECTION    . NO PAST SURGERIES       OB History    Gravida  2   Para  2   Term  2   Preterm      AB      Living  2     SAB      TAB      Ectopic      Multiple      Live Births  2           Family History  Problem Relation Age of Onset  . Hypertension Mother   . Colon cancer Father   . Pulmonary embolism Father   . Stroke Maternal Grandmother   . Hypertension Maternal Grandmother   . Other Neg Hx     Social History   Tobacco Use  . Smoking status: Never Smoker  . Smokeless tobacco: Never Used  Vaping Use  . Vaping Use: Never used  Substance Use Topics  . Alcohol use: No  . Drug use: No    Home Medications Prior to Admission medications   Medication Sig Start Date End Date Taking? Authorizing Provider  amoxicillin (AMOXIL) 500 MG capsule Take 1 capsule (500 mg total) by mouth 3 (three) times daily. 03/28/20   Arlyn Dunning, PA-C  ferrous sulfate 325 (65 FE) MG tablet  Take 1 tablet (325 mg total) by mouth daily with breakfast. 02/04/20   Fair, Hoyle Sauer, MD  fluticasone (FLONASE) 50 MCG/ACT nasal spray Place 1 spray into both nostrils daily. 11/22/18   Georgetta Haber, NP  megestrol (MEGACE) 40 MG tablet Take two tablets three times daily for 3 days, then two tablets twice daily for 3 days, then one tablet twice daily 02/06/20   Fair, Hoyle Sauer, MD  naproxen (NAPROSYN) 500 MG tablet Take 1 tablet (500 mg total) by mouth 2 (two) times daily. 03/28/20   Arlyn Dunning, PA-C  norethindrone (AYGESTIN) 5 MG tablet Take 1 tablet (5 mg total) by mouth daily for 10 days. 08/31/19 09/10/19  Sharlene Dory, DO    Allergies    Patient has no known allergies.  Review of Systems   Review of Systems  Constitutional: Positive for fever. Negative for appetite change.  HENT: Positive for dental problem. Negative for facial swelling and trouble swallowing.   Respiratory: Negative for cough and shortness of  breath.   Cardiovascular: Negative for chest pain.  Gastrointestinal: Negative for abdominal pain.  All other systems reviewed and are negative.   Physical Exam Updated Vital Signs BP (!) 132/86 (BP Location: Left Arm)   Pulse 91   Temp 99 F (37.2 C) (Oral)   Resp 18   SpO2 97%   Physical Exam Vitals and nursing note reviewed.  Constitutional:      General: She is not in acute distress.    Appearance: Normal appearance. She is not ill-appearing, toxic-appearing or diaphoretic.  HENT:     Head: Normocephalic and atraumatic.     Nose: Nose normal.     Mouth/Throat:     Mouth: Mucous membranes are moist.     Pharynx: Oropharynx is clear. No oropharyngeal exudate or posterior oropharyngeal erythema.     Comments: Poor dentition throughout. Gingival swelling in gums with TTP but no abscess/lesion/bleeding Eyes:     Conjunctiva/sclera: Conjunctivae normal.  Cardiovascular:     Rate and Rhythm: Normal rate and regular rhythm.  Pulmonary:     Effort:  Pulmonary effort is normal.  Skin:    General: Skin is dry.  Neurological:     Mental Status: She is alert.  Psychiatric:        Mood and Affect: Mood normal.     ED Results / Procedures / Treatments   Labs (all labs ordered are listed, but only abnormal results are displayed) Labs Reviewed - No data to display  EKG None  Radiology No results found.  Procedures Procedures (including critical care time)  Medications Ordered in ED Medications  amoxicillin (AMOXIL) capsule 1,000 mg (has no administration in time range)  ketorolac (TORADOL) injection 60 mg (has no administration in time range)    ED Course  I have reviewed the triage vital signs and the nursing notes.  Pertinent labs & imaging results that were available during my care of the patient were reviewed by me and considered in my medical decision making (see chart for details).    MDM Rules/Calculators/A&P                          Based on review of vitals, medical screening exam, lab work and/or imaging, there does not appear to be an acute, emergent etiology for the patient's symptoms. Counseled pt on good return precautions and encouraged both PCP and ED follow-up as needed.  Prior to discharge, I also discussed incidental imaging findings with patient in detail and advised appropriate, recommended follow-up in detail.  Clinical Impression: 1. Pain, dental     Disposition: Discharge  Prior to providing a prescription for a controlled substance, I independently reviewed the patient's recent prescription history on the [place] Controlled Substance Reporting System. The patient had no recent or regular prescriptions and was deemed appropriate for a brief, less than 3 day prescription of narcotic for acute analgesia.  This note was prepared with assistance of Conservation officer, historic buildings. Occasional wrong-word or sound-a-like substitutions may have occurred due to the inherent limitations of voice  recognition software.  Final Clinical Impression(s) / ED Diagnoses Final diagnoses:  Pain, dental    Rx / DC Orders ED Discharge Orders         Ordered    naproxen (NAPROSYN) 500 MG tablet  2 times daily     Discontinue  Reprint     03/28/20 0816    amoxicillin (AMOXIL) 500 MG capsule  3 times daily  Discontinue  Reprint     03/28/20 0816           Arlyn Dunning, PA-C 03/28/20 0816    Clarene Duke Ambrose Finland, MD 03/28/20 [phone]

## 2020-03-28 NOTE — ED Triage Notes (Signed)
Patient arrives to ED with complaints of dental pain since yesterday.

## 2020-03-28 NOTE — ED Notes (Signed)
AVS reviewed with pt who verbalized understanding. Pt ambulatory out of dept. °

## 2020-04-04 DIAGNOSIS — Z419 Encounter for procedure for purposes other than remedying health state, unspecified: Secondary | ICD-10-CM | POA: Diagnosis not present

## 2020-04-13 ENCOUNTER — Other Ambulatory Visit: Payer: Self-pay

## 2020-04-13 ENCOUNTER — Other Ambulatory Visit (HOSPITAL_COMMUNITY)
Admission: RE | Admit: 2020-04-13 | Discharge: 2020-04-13 | Disposition: A | Discharge status: Home / Self Care | Payer: Medicaid Other | Source: Ambulatory Visit | Attending: Student | Admitting: Student

## 2020-04-13 ENCOUNTER — Ambulatory Visit (INDEPENDENT_AMBULATORY_CARE_PROVIDER_SITE_OTHER): Payer: Medicaid Other

## 2020-04-13 DIAGNOSIS — Z113 Encounter for screening for infections with a predominantly sexual mode of transmission: Secondary | ICD-10-CM

## 2020-04-13 NOTE — Progress Notes (Signed)
I have reviewed this chart and agree with the RN/CMA assessment and management.    K. Meryl Jaziyah Gradel, M.D. Attending [facility] (Faculty Practice)   

## 2020-04-13 NOTE — Progress Notes (Signed)
Patient here today for STD testing. States her current partner was treated for Gonorrhea and Chlamydia before they had intercourse. Pt would like to be sure that partners treatment was effective. Self-swab instructions given and specimen obtained. Explained to pt we will contact her with any abnormal results.   Fleet Contras RN 04/13/20

## 2020-04-14 LAB — CERVICOVAGINAL ANCILLARY ONLY
Bacterial Vaginitis (gardnerella): POSITIVE — AB
Candida Glabrata: NEGATIVE
Candida Vaginitis: NEGATIVE
Chlamydia: NEGATIVE
Comment: NEGATIVE
Comment: NEGATIVE
Comment: NEGATIVE
Comment: NEGATIVE
Comment: NEGATIVE
Comment: NORMAL
Neisseria Gonorrhea: NEGATIVE
Trichomonas: NEGATIVE

## 2020-05-05 ENCOUNTER — Telehealth (INDEPENDENT_AMBULATORY_CARE_PROVIDER_SITE_OTHER): Payer: Medicaid Other | Admitting: Obstetrics & Gynecology

## 2020-05-05 DIAGNOSIS — N920 Excessive and frequent menstruation with regular cycle: Secondary | ICD-10-CM

## 2020-05-05 DIAGNOSIS — Z419 Encounter for procedure for purposes other than remedying health state, unspecified: Secondary | ICD-10-CM | POA: Diagnosis not present

## 2020-05-05 NOTE — Telephone Encounter (Signed)
Called patient and she would like to reschedule her Pelvic Ultrasound that she missed in June. CAlled Radiology to reschedule.   Patient was informed that Dr. Morene Antu is no longer in the office and will be scheduled under another provider. Reviewed she will be called with results and recommendation once [place] is returned. Patient voiced understanding.   Reordered under Dr. Macon Large and scheduled for 09/09 at 1 pm at [facility]. Patient to arrive at 12:45 with a full bladder. Patient did not answer when I called her back. LM on patients voicemail about appointment date, time, location and instructions.

## 2020-05-05 NOTE — Telephone Encounter (Signed)
Patient called to say she missed an appointment with ultrasound. She wants to know if she can get that rescheduled, or does she need to come in. She is requesting to speak with a nurse. She has questions.

## 2020-05-13 ENCOUNTER — Ambulatory Visit: Admission: RE | Admit: 2020-05-13 | Payer: Medicaid Other | Source: Ambulatory Visit

## 2020-06-04 DIAGNOSIS — Z419 Encounter for procedure for purposes other than remedying health state, unspecified: Secondary | ICD-10-CM | POA: Diagnosis not present

## 2020-07-01 DIAGNOSIS — F4323 Adjustment disorder with mixed anxiety and depressed mood: Secondary | ICD-10-CM | POA: Diagnosis not present

## 2020-07-05 DIAGNOSIS — Z419 Encounter for procedure for purposes other than remedying health state, unspecified: Secondary | ICD-10-CM | POA: Diagnosis not present

## 2020-07-08 DIAGNOSIS — F4323 Adjustment disorder with mixed anxiety and depressed mood: Secondary | ICD-10-CM | POA: Diagnosis not present

## 2020-07-15 DIAGNOSIS — F4323 Adjustment disorder with mixed anxiety and depressed mood: Secondary | ICD-10-CM | POA: Diagnosis not present

## 2020-07-27 DIAGNOSIS — F4323 Adjustment disorder with mixed anxiety and depressed mood: Secondary | ICD-10-CM | POA: Diagnosis not present

## 2020-08-04 DIAGNOSIS — F4323 Adjustment disorder with mixed anxiety and depressed mood: Secondary | ICD-10-CM | POA: Diagnosis not present

## 2020-08-04 DIAGNOSIS — Z419 Encounter for procedure for purposes other than remedying health state, unspecified: Secondary | ICD-10-CM | POA: Diagnosis not present

## 2020-08-11 DIAGNOSIS — F4323 Adjustment disorder with mixed anxiety and depressed mood: Secondary | ICD-10-CM | POA: Diagnosis not present

## 2020-08-16 DIAGNOSIS — L732 Hidradenitis suppurativa: Secondary | ICD-10-CM | POA: Diagnosis not present

## 2020-08-18 DIAGNOSIS — F4323 Adjustment disorder with mixed anxiety and depressed mood: Secondary | ICD-10-CM | POA: Diagnosis not present

## 2020-08-31 DIAGNOSIS — F4323 Adjustment disorder with mixed anxiety and depressed mood: Secondary | ICD-10-CM | POA: Diagnosis not present

## 2020-09-04 DIAGNOSIS — Z419 Encounter for procedure for purposes other than remedying health state, unspecified: Secondary | ICD-10-CM | POA: Diagnosis not present

## 2020-09-14 DIAGNOSIS — F4323 Adjustment disorder with mixed anxiety and depressed mood: Secondary | ICD-10-CM | POA: Diagnosis not present

## 2020-09-24 DIAGNOSIS — F4323 Adjustment disorder with mixed anxiety and depressed mood: Secondary | ICD-10-CM | POA: Diagnosis not present

## 2020-10-05 DIAGNOSIS — Z419 Encounter for procedure for purposes other than remedying health state, unspecified: Secondary | ICD-10-CM | POA: Diagnosis not present

## 2020-10-28 DIAGNOSIS — F4323 Adjustment disorder with mixed anxiety and depressed mood: Secondary | ICD-10-CM | POA: Diagnosis not present

## 2020-11-02 DIAGNOSIS — Z419 Encounter for procedure for purposes other than remedying health state, unspecified: Secondary | ICD-10-CM | POA: Diagnosis not present

## 2020-11-17 DIAGNOSIS — F4323 Adjustment disorder with mixed anxiety and depressed mood: Secondary | ICD-10-CM | POA: Diagnosis not present

## 2020-12-03 DIAGNOSIS — Z419 Encounter for procedure for purposes other than remedying health state, unspecified: Secondary | ICD-10-CM | POA: Diagnosis not present

## 2020-12-08 DIAGNOSIS — F4323 Adjustment disorder with mixed anxiety and depressed mood: Secondary | ICD-10-CM | POA: Diagnosis not present

## 2021-01-02 DIAGNOSIS — Z419 Encounter for procedure for purposes other than remedying health state, unspecified: Secondary | ICD-10-CM | POA: Diagnosis not present

## 2021-02-02 DIAGNOSIS — Z419 Encounter for procedure for purposes other than remedying health state, unspecified: Secondary | ICD-10-CM | POA: Diagnosis not present

## 2021-02-16 ENCOUNTER — Telehealth: Payer: Self-pay | Admitting: *Deleted

## 2021-02-16 ENCOUNTER — Telehealth: Payer: Medicaid Other | Admitting: Family

## 2021-02-16 DIAGNOSIS — N939 Abnormal uterine and vaginal bleeding, unspecified: Secondary | ICD-10-CM

## 2021-02-16 MED ORDER — MEGESTROL ACETATE 40 MG PO TABS: 40.0000 mg | ORAL_TABLET | Freq: Two times a day (BID) | ORAL | 0 refills | Status: DC

## 2021-02-16 NOTE — Telephone Encounter (Signed)
Spoke with Dr. Shawnie Pons about refill for Megace.   Recommendation is that patient follow up and have her [place] that she has been scheduled x 2 and patient did not go to have done. Then patient needs to follow up in the office to meet with a provider for reassessment.   Approved to sent in 40 mg BID for 1 month and then will need to follow up in the office. Patient is not to have more refills until she is seen in the office for reevaluation.   Called patient, she did not answer. LM for her to call the office at her convenience and/or check her My Chart message. My Chart message sent.

## 2021-02-16 NOTE — Progress Notes (Signed)
Based on what you shared with me, I feel your condition warrants further evaluation and I recommend that you be seen in a face to face office visit.   I recommend calling your GYN and follow up with them. This is not something we handle through an Evisit.    NOTE: If you entered your credit card information for this eVisit, you will not be charged. You may see a "hold" on your card for the $35 but that hold will drop off and you will not have a charge processed.   If you are having a true medical emergency please call 911.      For an urgent face to face visit, [facility] has six urgent care centers for your convenience:     [facility] at [facility] Directions [phone] [address] [place], [place] [address] 8 am - 4 pm Monday - Friday    [facility]) Get Driving Directions [phone] [address] [place], [place] [address] 8 am to 8 pm Monday-Friday 10 am to 6 pm [facility] Urgent Care Center [facility]) Get Driving Directions [phone]  [address] [place],  [place]  [address] 8 am to 8 pm Monday-Friday 8 am to 4 pm [facility] Urgent Care at [facility] Get Driving Directions [phone] [address] [place], [place] [address] 8 am to 8 pm Monday-Friday 8 am to 4 pm [facility] Urgent Care at [facility] Get Driving Directions  [phone] [address] [place], [place] [address] 8 am to 8 pm Monday-Friday 8 am to 4 pm [facility] Urgent Care at [facility] Directions [phone] [address] [place], [place] [address] 8 am to 8 pm Monday-Friday 8 am to 4 pm Saturday-Sunday     Your MyChart E-visit questionnaire answers were reviewed by a board certified advanced clinical practitioner to complete your  personal care plan based on your specific symptoms.  Thank you for using e-Visits.

## 2021-02-16 NOTE — Telephone Encounter (Signed)
Jean Glass left a message this pm she is calling for Dr. Jerilynn Birkenhead to see if she can get her megace refilled because her's expired. Jean Gupta,RN

## 2021-03-04 DIAGNOSIS — Z419 Encounter for procedure for purposes other than remedying health state, unspecified: Secondary | ICD-10-CM | POA: Diagnosis not present

## 2021-03-13 DIAGNOSIS — F4323 Adjustment disorder with mixed anxiety and depressed mood: Secondary | ICD-10-CM | POA: Diagnosis not present

## 2021-03-27 DIAGNOSIS — F4323 Adjustment disorder with mixed anxiety and depressed mood: Secondary | ICD-10-CM | POA: Diagnosis not present

## 2021-03-30 ENCOUNTER — Telehealth: Payer: Medicaid Other | Admitting: Family

## 2021-03-30 ENCOUNTER — Telehealth: Payer: Medicaid Other

## 2021-03-30 NOTE — Progress Notes (Signed)
Discussed we could not do Video visit for daughter under her medical chart. Encouraged her to make her daughter a MyChart or take her to the Urgent Care.   Jannifer Rodney, FNP

## 2021-04-04 DIAGNOSIS — Z419 Encounter for procedure for purposes other than remedying health state, unspecified: Secondary | ICD-10-CM | POA: Diagnosis not present

## 2021-04-24 DIAGNOSIS — F4323 Adjustment disorder with mixed anxiety and depressed mood: Secondary | ICD-10-CM | POA: Diagnosis not present

## 2021-05-05 DIAGNOSIS — Z419 Encounter for procedure for purposes other than remedying health state, unspecified: Secondary | ICD-10-CM | POA: Diagnosis not present

## 2021-05-25 DIAGNOSIS — R635 Abnormal weight gain: Secondary | ICD-10-CM | POA: Diagnosis not present

## 2021-05-25 DIAGNOSIS — E559 Vitamin D deficiency, unspecified: Secondary | ICD-10-CM | POA: Diagnosis not present

## 2021-05-25 DIAGNOSIS — Z6836 Body mass index (BMI) 36.0-36.9, adult: Secondary | ICD-10-CM | POA: Diagnosis not present

## 2021-05-25 DIAGNOSIS — N951 Menopausal and female climacteric states: Secondary | ICD-10-CM | POA: Diagnosis not present

## 2021-05-25 DIAGNOSIS — D509 Iron deficiency anemia, unspecified: Secondary | ICD-10-CM | POA: Diagnosis not present

## 2021-05-25 DIAGNOSIS — E8881 Metabolic syndrome: Secondary | ICD-10-CM | POA: Diagnosis not present

## 2021-05-25 DIAGNOSIS — E785 Hyperlipidemia, unspecified: Secondary | ICD-10-CM | POA: Diagnosis not present

## 2021-05-31 DIAGNOSIS — D509 Iron deficiency anemia, unspecified: Secondary | ICD-10-CM | POA: Diagnosis not present

## 2021-05-31 DIAGNOSIS — Z1331 Encounter for screening for depression: Secondary | ICD-10-CM | POA: Diagnosis not present

## 2021-05-31 DIAGNOSIS — E785 Hyperlipidemia, unspecified: Secondary | ICD-10-CM | POA: Diagnosis not present

## 2021-05-31 DIAGNOSIS — Z6837 Body mass index (BMI) 37.0-37.9, adult: Secondary | ICD-10-CM | POA: Diagnosis not present

## 2021-05-31 DIAGNOSIS — F419 Anxiety disorder, unspecified: Secondary | ICD-10-CM | POA: Diagnosis not present

## 2021-05-31 DIAGNOSIS — Z1339 Encounter for screening examination for other mental health and behavioral disorders: Secondary | ICD-10-CM | POA: Diagnosis not present

## 2021-05-31 DIAGNOSIS — E8881 Metabolic syndrome: Secondary | ICD-10-CM | POA: Diagnosis not present

## 2021-05-31 DIAGNOSIS — R635 Abnormal weight gain: Secondary | ICD-10-CM | POA: Diagnosis not present

## 2021-05-31 DIAGNOSIS — N926 Irregular menstruation, unspecified: Secondary | ICD-10-CM | POA: Diagnosis not present

## 2021-05-31 DIAGNOSIS — R7303 Prediabetes: Secondary | ICD-10-CM | POA: Diagnosis not present

## 2021-05-31 DIAGNOSIS — E559 Vitamin D deficiency, unspecified: Secondary | ICD-10-CM | POA: Diagnosis not present

## 2021-06-04 DIAGNOSIS — Z419 Encounter for procedure for purposes other than remedying health state, unspecified: Secondary | ICD-10-CM | POA: Diagnosis not present

## 2021-06-08 DIAGNOSIS — Z6837 Body mass index (BMI) 37.0-37.9, adult: Secondary | ICD-10-CM | POA: Diagnosis not present

## 2021-06-08 DIAGNOSIS — E559 Vitamin D deficiency, unspecified: Secondary | ICD-10-CM | POA: Diagnosis not present

## 2021-07-05 DIAGNOSIS — Z419 Encounter for procedure for purposes other than remedying health state, unspecified: Secondary | ICD-10-CM | POA: Diagnosis not present

## 2021-07-05 DIAGNOSIS — F4323 Adjustment disorder with mixed anxiety and depressed mood: Secondary | ICD-10-CM | POA: Diagnosis not present

## 2021-08-04 DIAGNOSIS — Z419 Encounter for procedure for purposes other than remedying health state, unspecified: Secondary | ICD-10-CM | POA: Diagnosis not present

## 2021-09-04 DIAGNOSIS — Z419 Encounter for procedure for purposes other than remedying health state, unspecified: Secondary | ICD-10-CM | POA: Diagnosis not present

## 2021-09-20 DIAGNOSIS — F4323 Adjustment disorder with mixed anxiety and depressed mood: Secondary | ICD-10-CM | POA: Diagnosis not present

## 2021-10-05 DIAGNOSIS — Z419 Encounter for procedure for purposes other than remedying health state, unspecified: Secondary | ICD-10-CM | POA: Diagnosis not present

## 2021-10-10 DIAGNOSIS — E669 Obesity, unspecified: Secondary | ICD-10-CM | POA: Diagnosis not present

## 2021-10-10 DIAGNOSIS — Z87891 Personal history of nicotine dependence: Secondary | ICD-10-CM | POA: Diagnosis not present

## 2021-10-10 DIAGNOSIS — Z13220 Encounter for screening for lipoid disorders: Secondary | ICD-10-CM | POA: Diagnosis not present

## 2021-10-10 DIAGNOSIS — Z8249 Family history of ischemic heart disease and other diseases of the circulatory system: Secondary | ICD-10-CM | POA: Diagnosis not present

## 2021-10-10 DIAGNOSIS — D649 Anemia, unspecified: Secondary | ICD-10-CM | POA: Diagnosis not present

## 2021-10-10 DIAGNOSIS — E65 Localized adiposity: Secondary | ICD-10-CM | POA: Diagnosis not present

## 2021-10-10 DIAGNOSIS — Z0000 Encounter for general adult medical examination without abnormal findings: Secondary | ICD-10-CM | POA: Diagnosis not present

## 2021-10-10 DIAGNOSIS — Z0001 Encounter for general adult medical examination with abnormal findings: Secondary | ICD-10-CM | POA: Diagnosis not present

## 2021-10-10 DIAGNOSIS — N921 Excessive and frequent menstruation with irregular cycle: Secondary | ICD-10-CM | POA: Diagnosis not present

## 2021-10-10 DIAGNOSIS — Z13228 Encounter for screening for other metabolic disorders: Secondary | ICD-10-CM | POA: Diagnosis not present

## 2021-10-10 DIAGNOSIS — Z6835 Body mass index (BMI) 35.0-35.9, adult: Secondary | ICD-10-CM | POA: Diagnosis not present

## 2021-11-02 DIAGNOSIS — Z419 Encounter for procedure for purposes other than remedying health state, unspecified: Secondary | ICD-10-CM | POA: Diagnosis not present

## 2021-12-03 DIAGNOSIS — Z419 Encounter for procedure for purposes other than remedying health state, unspecified: Secondary | ICD-10-CM | POA: Diagnosis not present

## 2022-01-02 DIAGNOSIS — Z419 Encounter for procedure for purposes other than remedying health state, unspecified: Secondary | ICD-10-CM | POA: Diagnosis not present

## 2022-01-08 DIAGNOSIS — F4323 Adjustment disorder with mixed anxiety and depressed mood: Secondary | ICD-10-CM | POA: Diagnosis not present

## 2022-01-11 DIAGNOSIS — Z113 Encounter for screening for infections with a predominantly sexual mode of transmission: Secondary | ICD-10-CM | POA: Diagnosis not present

## 2022-01-11 DIAGNOSIS — E669 Obesity, unspecified: Secondary | ICD-10-CM | POA: Diagnosis not present

## 2022-01-11 DIAGNOSIS — N898 Other specified noninflammatory disorders of vagina: Secondary | ICD-10-CM | POA: Diagnosis not present

## 2022-01-11 DIAGNOSIS — Z6835 Body mass index (BMI) 35.0-35.9, adult: Secondary | ICD-10-CM | POA: Diagnosis not present

## 2022-01-11 DIAGNOSIS — L02411 Cutaneous abscess of right axilla: Secondary | ICD-10-CM | POA: Diagnosis not present

## 2022-02-02 DIAGNOSIS — Z419 Encounter for procedure for purposes other than remedying health state, unspecified: Secondary | ICD-10-CM | POA: Diagnosis not present

## 2022-02-03 DIAGNOSIS — E65 Localized adiposity: Secondary | ICD-10-CM | POA: Diagnosis not present

## 2022-02-06 DIAGNOSIS — L02411 Cutaneous abscess of right axilla: Secondary | ICD-10-CM | POA: Diagnosis not present

## 2022-02-06 DIAGNOSIS — E669 Obesity, unspecified: Secondary | ICD-10-CM | POA: Diagnosis not present

## 2022-02-06 DIAGNOSIS — L732 Hidradenitis suppurativa: Secondary | ICD-10-CM | POA: Diagnosis not present

## 2022-02-06 DIAGNOSIS — Z6835 Body mass index (BMI) 35.0-35.9, adult: Secondary | ICD-10-CM | POA: Diagnosis not present

## 2022-02-12 ENCOUNTER — Other Ambulatory Visit: Payer: Self-pay | Admitting: Family Medicine

## 2022-02-12 DIAGNOSIS — F4323 Adjustment disorder with mixed anxiety and depressed mood: Secondary | ICD-10-CM | POA: Diagnosis not present

## 2022-03-23 ENCOUNTER — Telehealth: Payer: Self-pay

## 2022-03-23 ENCOUNTER — Ambulatory Visit (INDEPENDENT_AMBULATORY_CARE_PROVIDER_SITE_OTHER): Payer: Medicaid Other | Admitting: Obstetrics and Gynecology

## 2022-03-23 ENCOUNTER — Encounter: Payer: Self-pay | Admitting: Obstetrics and Gynecology

## 2022-03-23 ENCOUNTER — Other Ambulatory Visit (HOSPITAL_COMMUNITY)
Admission: RE | Admit: 2022-03-23 | Discharge: 2022-03-23 | Disposition: A | Discharge status: Home / Self Care | Payer: Medicaid Other | Source: Ambulatory Visit | Attending: Obstetrics and Gynecology | Admitting: Obstetrics and Gynecology

## 2022-03-23 VITALS — BP 122/76 | HR 84 | Ht 64.0 in | Wt 214.8 lb

## 2022-03-23 DIAGNOSIS — N921 Excessive and frequent menstruation with irregular cycle: Secondary | ICD-10-CM | POA: Diagnosis not present

## 2022-03-23 DIAGNOSIS — N920 Excessive and frequent menstruation with regular cycle: Secondary | ICD-10-CM | POA: Insufficient documentation

## 2022-03-23 DIAGNOSIS — Z3202 Encounter for pregnancy test, result negative: Secondary | ICD-10-CM

## 2022-03-23 LAB — POCT PREGNANCY, URINE: Preg Test, Ur: NEGATIVE

## 2022-03-23 NOTE — Progress Notes (Signed)
  CC: heavy menses Subjective:    Patient ID: Jean Glass, female    DOB: 22-Jul-1986, 36 y.o.   MRN: [id number]  Gynecologic Exam   36 yo G2P2, SVD x 1 and c/s x 1, seen for discussion of heavy menses.  This is the second occurrence of hevay bleeding this year.Megace has been helpful in the past, but she is not on it now.  Menses are regular, lasting 5-6 days.  However, the last menses lasted 10 days.  Each day of menses is heavy and patient does pass clots.   Review of Systems  Genitourinary:  Positive for menstrual problem. Negative for dyspareunia.       Objective:   Physical Exam Vitals reviewed.  Constitutional:      Appearance: Normal appearance. She is obese.  HENT:     Head: Normocephalic and atraumatic.  Cardiovascular:     Rate and Rhythm: Normal rate and regular rhythm.     Heart sounds: Normal heart sounds.  Pulmonary:     Effort: Pulmonary effort is normal.     Breath sounds: Normal breath sounds.  Abdominal:     General: Abdomen is flat. There is no distension.     Palpations: Abdomen is soft.     Tenderness: There is no abdominal tenderness.  Genitourinary:    Comments: SSE: normal vagina, cervix and uterus Uterus mobile, no obvious fibroids palpated. Neurological:     Mental Status: She is alert.    Vitals:   03/23/22 1540  BP: 122/76  Pulse: 84         Assessment & Plan:   1. Menorrhagia with irregular cycle Will check endometrial biopsy, see separate note Pelvic ultrasound ordered Follow up in 5 weeks  - US PELVIC COMPLETE WITH TRANSVAGINAL; Future - Surgical pathology( [place]/ [facility])    Warden Fillers, MD Faculty Attending, Center for [facility]

## 2022-03-23 NOTE — Telephone Encounter (Signed)
Called Pt to go over U/S appt on 04/03/22 at 3 pm, arrive at 2:30 p with full bladder. No answer, left VM.

## 2022-03-24 NOTE — Progress Notes (Signed)
ENDOMETRIAL BIOPSY      Jean Glass is a 36 y.o. [phone] here for endometrial biopsy.  The indications for endometrial biopsy were reviewed.  Risks of the biopsy including cramping, bleeding, infection, uterine perforation, inadequate specimen and need for additional procedures were discussed. The patient states she understands and agrees to undergo procedure today. Consent was signed. Time out was performed.   Indications: menorrhagia Urine HCG: neg  A bivalve speculum was placed into the vagina and the cervix was easily visualized and was prepped with Betadine x2. A single-toothed tenaculum was placed on the anterior lip of the cervix to stabilize it. The 3 mm pipelle was introduced into the endometrial cavity without difficulty to a depth of 9 cm, and a moderate amount of tissue was obtained and sent to pathology. This was repeated for a total of 2 passes. The instruments were removed from the patient's vagina. Minimal bleeding from the cervix at the tenaculum was noted.   The patient tolerated the procedure well. Routine post-procedure instructions were given to the patient.    Will base further management on results of biopsy.  Mariel Aloe, MD

## 2022-03-27 LAB — SURGICAL PATHOLOGY

## 2022-04-03 ENCOUNTER — Ambulatory Visit
Admission: RE | Admit: 2022-04-03 | Discharge: 2022-04-03 | Disposition: A | Discharge status: Home / Self Care | Payer: Medicaid Other | Source: Ambulatory Visit | Attending: Obstetrics and Gynecology | Admitting: Obstetrics and Gynecology

## 2022-04-03 DIAGNOSIS — N921 Excessive and frequent menstruation with irregular cycle: Secondary | ICD-10-CM | POA: Diagnosis not present

## 2022-05-22 ENCOUNTER — Ambulatory Visit: Payer: Self-pay | Admitting: Obstetrics and Gynecology

## 2022-05-30 ENCOUNTER — Other Ambulatory Visit: Payer: Self-pay | Admitting: Family Medicine

## 2022-05-31 MED ORDER — MEGESTROL ACETATE 40 MG PO TABS: 40.0000 mg | ORAL_TABLET | Freq: Two times a day (BID) | ORAL | 0 refills | Status: DC

## 2022-06-28 ENCOUNTER — Ambulatory Visit: Payer: Self-pay | Admitting: Obstetrics and Gynecology

## 2022-10-20 ENCOUNTER — Telehealth: Payer: Self-pay | Admitting: Physician Assistant

## 2022-10-20 ENCOUNTER — Other Ambulatory Visit: Payer: Self-pay | Admitting: Obstetrics and Gynecology

## 2022-10-20 DIAGNOSIS — R9389 Abnormal findings on diagnostic imaging of other specified body structures: Secondary | ICD-10-CM

## 2022-10-20 DIAGNOSIS — N921 Excessive and frequent menstruation with irregular cycle: Secondary | ICD-10-CM

## 2022-10-20 MED ORDER — MEGESTROL ACETATE 40 MG PO TABS: 40.0000 mg | ORAL_TABLET | Freq: Two times a day (BID) | ORAL | 0 refills | Status: DC

## 2022-10-20 NOTE — Progress Notes (Signed)
Virtual Visit Consent   Jean Glass, you are scheduled for a virtual visit with a [facility] provider today. Just as with appointments in the office, your consent must be obtained to participate. Your consent will be active for this visit and any virtual visit you may have with one of our providers in the next 365 days. If you have a MyChart account, a copy of this consent can be sent to you electronically.  As this is a virtual visit, video technology does not allow for your provider to perform a traditional examination. This may limit your provider's ability to fully assess your condition. If your provider identifies any concerns that need to be evaluated in person or the need to arrange testing (such as labs, EKG, etc.), we will make arrangements to do so. Although advances in technology are sophisticated, we cannot ensure that it will always work on either your end or our end. If the connection with a video visit is poor, the visit may have to be switched to a telephone visit. With either a video or telephone visit, we are not always able to ensure that we have a secure connection.  By engaging in this virtual visit, you consent to the provision of healthcare and authorize for your insurance to be billed (if applicable) for the services provided during this visit. Depending on your insurance coverage, you may receive a charge related to this service.  I need to obtain your verbal consent now. Are you willing to proceed with your visit today? ELEEN SCHAU has provided verbal consent on 10/20/2022 for a virtual visit (video or telephone). Leeanne Rio, [place]  Date: 10/20/2022 12:49 PM  Virtual Visit via Video Note   I, Leeanne Rio, connected with  Jean Glass  ([id number], February 13, 1986) on 10/20/22 at 12:30 PM EST by a video-enabled telemedicine application and verified that I am speaking with the correct person using two identifiers.  Location: Patient: Virtual  Visit Location Patient: Home Provider: Virtual Visit Location Provider: Home Office   I discussed the limitations of evaluation and management by telemedicine and the availability of in person appointments. The patient expressed understanding and agreed to proceed.    History of Present Illness: Jean Glass is a 37 y.o. who identifies as a female who was assigned female at birth, and is being seen today for questions regarding her menorrhagia. This has been an ongoing issue for patient. As such she was evaluated by GYN last Summer including [place] that revealed endometrial thickness of 17 mm with a possible 10 mm endometrial mass. Was started on iron supplementation and Megace as needed. Further evaluation planned but insurance changed and patient has not followed up on this. She has had irregular periods with last one before current, November of last year. This period has been even heavier than her usual. Denies excess clotting. Is staying hydrated. Denies lightheadedness or dizziness. Denies SOB. Notes some fatigue. Has not been taking her iron supplement. Contacted GYN for refill of Megace which was just sent in.    HPI: HPI  Problems:  Patient Active Problem List   Diagnosis Date Noted   Menorrhagia 03/23/2022   OBESITY 02/04/2010   FATIGUE 02/04/2010   WEIGHT GAIN 02/04/2010    Allergies: No Known Allergies Medications:  Current Outpatient Medications:    amoxicillin (AMOXIL) 500 MG capsule, Take 1 capsule (500 mg total) by mouth 3 (three) times daily., Disp: 21 capsule, Rfl: 0   ferrous sulfate 325 (65  FE) MG tablet, Take 1 tablet (325 mg total) by mouth daily with breakfast., Disp: 90 tablet, Rfl: 1   fluticasone (FLONASE) 50 MCG/ACT nasal spray, Place 1 spray into both nostrils daily., Disp: 16 g, Rfl: 2   megestrol (MEGACE) 40 MG tablet, Take 1 tablet (40 mg total) by mouth 2 (two) times daily., Disp: 60 tablet, Rfl: 0   naproxen (NAPROSYN) 500 MG tablet, Take 1 tablet (500 mg  total) by mouth 2 (two) times daily. (Patient not taking: Reported on 03/23/2022), Disp: 30 tablet, Rfl: 0   norethindrone (AYGESTIN) 5 MG tablet, Take 1 tablet (5 mg total) by mouth daily for 10 days., Disp: 10 tablet, Rfl: 0  Observations/Objective: Patient is well-developed, well-nourished in no acute distress.  Resting comfortably at home.  Head is normocephalic, atraumatic.  No labored breathing. Speech is clear and coherent with logical content.  Patient is alert and oriented at baseline.   Assessment and Plan: 1. Menorrhagia with irregular cycle  2. Abnormal pelvic ultrasound  Ongoing issue. Per EMR review her GYN sent in refill of Megace. She is aware of this. She is to restart daily iron supplement, hydrate, rest and take Megace as directed by her GYN. Follow-up with GYN first thing Monday as long as symptoms remain stable or improving. Strict ER precautions reviewed. Regardless of current heavy period, she needs follow-up with her GYN due to abnormal pelvic US from 03/2022 with endometrial lesions that she never followed up on due to insurance changes.   Follow Up Instructions: I discussed the assessment and treatment plan with the patient. The patient was provided an opportunity to ask questions and all were answered. The patient agreed with the plan and demonstrated an understanding of the instructions.  A copy of instructions were sent to the patient via MyChart unless otherwise noted below.   The patient was advised to call back or seek an in-person evaluation if the symptoms worsen or if the condition fails to improve as anticipated.  Time:  I spent 10 minutes with the patient via telehealth technology discussing the above problems/concerns.    Leeanne Rio, PA-C

## 2022-10-20 NOTE — Patient Instructions (Signed)
  Mathews Robinsons, thank you for joining Leeanne Rio, PA-C for today's virtual visit.  While this provider is not your primary care provider (PCP), if your PCP is located in our provider database this encounter information will be shared with them immediately following your visit.   [facility] account gives you access to today's visit and all your visits, tests, and labs performed at [facility] " click here if you don't have a [facility] account or go to [email or web address]  Consent: (Patient) ALNITA SCHEURER provided verbal consent for this virtual visit at the beginning of the encounter.  Current Medications:  Current Outpatient Medications:    amoxicillin (AMOXIL) 500 MG capsule, Take 1 capsule (500 mg total) by mouth 3 (three) times daily., Disp: 21 capsule, Rfl: 0   ferrous sulfate 325 (65 FE) MG tablet, Take 1 tablet (325 mg total) by mouth daily with breakfast., Disp: 90 tablet, Rfl: 1   fluticasone (FLONASE) 50 MCG/ACT nasal spray, Place 1 spray into both nostrils daily., Disp: 16 g, Rfl: 2   megestrol (MEGACE) 40 MG tablet, Take 1 tablet (40 mg total) by mouth 2 (two) times daily., Disp: 60 tablet, Rfl: 0   naproxen (NAPROSYN) 500 MG tablet, Take 1 tablet (500 mg total) by mouth 2 (two) times daily. (Patient not taking: Reported on 03/23/2022), Disp: 30 tablet, Rfl: 0   norethindrone (AYGESTIN) 5 MG tablet, Take 1 tablet (5 mg total) by mouth daily for 10 days., Disp: 10 tablet, Rfl: 0   Medications ordered in this encounter:  No orders of the defined types were placed in this encounter.    *If you need refills on other medications prior to your next appointment, please contact your pharmacy*  Follow-Up: Call back or seek an in-person evaluation if the symptoms worsen or if the condition fails to improve as anticipated.  [place] [phone]  Other Instructions Restart daily iron supplement Hydrate, rest  and take Megace as directed by your GYN. Follow-up with GYN first thing Monday as long as symptoms remain stable or improving.  If anything worsens or you note shortness of breath, lightheadedness or chest discomfort, you need an ER evaluation ASAP   If you have been instructed to have an in-person evaluation today at a local Urgent Care facility, please use the link below. It will take you to a list of all of our available [facility], including address, phone number and hours of operation. Please do not delay care.  [facility]  If you or a family member do not have a primary care provider, use the link below to schedule a visit and establish care. When you choose a [facility] primary care physician or advanced practice provider, you gain a long-term partner in health. Find a Primary Care Provider  Learn more about 's in-office and virtual care options: [facility]

## 2022-12-20 ENCOUNTER — Other Ambulatory Visit: Payer: Self-pay

## 2022-12-20 ENCOUNTER — Other Ambulatory Visit (HOSPITAL_COMMUNITY)
Admission: RE | Admit: 2022-12-20 | Discharge: 2022-12-20 | Disposition: A | Discharge status: Home / Self Care | Payer: Medicaid Other | Source: Ambulatory Visit | Attending: Obstetrics and Gynecology | Admitting: Obstetrics and Gynecology

## 2022-12-20 ENCOUNTER — Ambulatory Visit (INDEPENDENT_AMBULATORY_CARE_PROVIDER_SITE_OTHER): Payer: Medicaid Other | Admitting: Obstetrics and Gynecology

## 2022-12-20 ENCOUNTER — Encounter: Payer: Self-pay | Admitting: Obstetrics and Gynecology

## 2022-12-20 VITALS — BP 134/86 | HR 111 | Ht 64.0 in | Wt 213.4 lb

## 2022-12-20 DIAGNOSIS — N898 Other specified noninflammatory disorders of vagina: Secondary | ICD-10-CM

## 2022-12-20 DIAGNOSIS — N921 Excessive and frequent menstruation with irregular cycle: Secondary | ICD-10-CM | POA: Diagnosis not present

## 2022-12-20 NOTE — Progress Notes (Signed)
  CC: gyn follow up Subjective:    Patient ID: Jean Glass, female    DOB: 14-Oct-1985, 37 y.o.   MRN: [id number]  HPI Pt seen for follow up.  She continues to have some irregular heavy bleeding if she does not take megace.  Previous pelvic ultrasound showed possible endometrial focus.  Endometrial biopsy was negative.  Treatment options were discussed.   Review of Systems     Objective:   Physical Exam Vitals:   12/20/22 1333  BP: 134/86  Pulse: (!) 111   CLINICAL DATA:  Menorrhagia   EXAM: TRANSABDOMINAL AND TRANSVAGINAL ULTRASOUND OF PELVIS   TECHNIQUE: Both transabdominal and transvaginal ultrasound examinations of the pelvis were performed. Transabdominal technique was performed for global imaging of the pelvis including uterus, ovaries, adnexal regions, and pelvic cul-de-sac. It was necessary to proceed with endovaginal exam following the transabdominal exam to visualize the uterus endometrium ovaries.   COMPARISON:  None Available.   FINDINGS: Uterus   Measurements: 11.1 x 5.7 x 6.5 cm = volume: 214 mL. No fibroids or other mass visualized.   Endometrium   Thickness: 17 mm.  Possible echogenic mass measuring 10 mm   Right ovary   Measurements: 5.2 x 3.4 x 4 cm = volume: 36.3 mL. Cyst measuring 4.6 x 3 x 3.8 cm. No follow up imaging recommended. Note: This recommendation does not apply to premenarchal patients or to those with increased risk (genetic, family history, elevated tumor markers or other high-risk factors) of ovarian cancer. Reference: Radiology 2019 Nov; 293(2):359-371.   Left ovary   Measurements: 4.6 x 2 x 2.2 cm = volume: 10.4 mL. Normal appearance/no adnexal mass.   Other findings   Trace free fluid   IMPRESSION: 1. Endometrial thickness of 17 mm with possible echogenic endometrial mass measuring 10 mm. Consider further evaluation with sonohysterogram for confirmation prior to hysteroscopy. Endometrial sampling should also  be considered if patient is at high risk for endometrial carcinoma. (Ref: Radiological Reasoning: Algorithmic Workup of Abnormal Vaginal Bleeding with Endovaginal Sonography and Sonohysterography. AJR 2008; 782:N56-21) 2. Trace free fluid   SSE: normal appearing vagina and cervix, no abnl discharge  or odor.      Assessment & Plan:   1. Vaginal odor Ancillary swab taken, treatment pending results - Cervicovaginal ancillary only( [facility])  2. Menorrhagia with irregular cycle [N92.1] Since it has bee a year since last evaluation/intervention, will schedule patient for hysteroscopy D and C with possible myosure.  If no polyp consider OCP, progesterone IUD,  uterine ablation or TXA.                   Pt is due for pap smear, but due to insurance issues she was referred to [facility]  Warden Fillers, MD Faculty Attending, [facility]

## 2022-12-21 LAB — CERVICOVAGINAL ANCILLARY ONLY
Bacterial Vaginitis (gardnerella): POSITIVE — AB
Candida Glabrata: NEGATIVE
Candida Vaginitis: NEGATIVE
Chlamydia: NEGATIVE
Comment: NEGATIVE
Comment: NEGATIVE
Comment: NEGATIVE
Comment: NEGATIVE
Comment: NEGATIVE
Comment: NORMAL
Neisseria Gonorrhea: NEGATIVE
Trichomonas: NEGATIVE

## 2022-12-26 ENCOUNTER — Telehealth: Payer: Self-pay

## 2022-12-26 NOTE — Telephone Encounter (Addendum)
-----   Message from Warden Fillers, MD sent at 12/22/2022  3:23 PM EDT ----- BV noted on swab offer treatment  Left message to return call for results.  MyChart message sent.   Leonette Nutting  4/23/824

## 2022-12-27 ENCOUNTER — Other Ambulatory Visit: Payer: Self-pay

## 2022-12-27 MED ORDER — METRONIDAZOLE 500 MG PO TABS: 500.0000 mg | ORAL_TABLET | Freq: Two times a day (BID) | ORAL | 0 refills | Status: AC

## 2023-01-03 DIAGNOSIS — Z419 Encounter for procedure for purposes other than remedying health state, unspecified: Secondary | ICD-10-CM | POA: Diagnosis not present

## 2023-02-01 ENCOUNTER — Other Ambulatory Visit: Payer: Self-pay | Admitting: Obstetrics and Gynecology

## 2023-02-01 ENCOUNTER — Encounter (HOSPITAL_BASED_OUTPATIENT_CLINIC_OR_DEPARTMENT_OTHER): Payer: Self-pay | Admitting: Obstetrics and Gynecology

## 2023-02-02 ENCOUNTER — Encounter (HOSPITAL_BASED_OUTPATIENT_CLINIC_OR_DEPARTMENT_OTHER): Payer: Self-pay | Admitting: Obstetrics and Gynecology

## 2023-02-03 DIAGNOSIS — Z419 Encounter for procedure for purposes other than remedying health state, unspecified: Secondary | ICD-10-CM | POA: Diagnosis not present

## 2023-02-08 ENCOUNTER — Institutional Professional Consult (permissible substitution): Payer: Medicaid Other | Admitting: Obstetrics and Gynecology

## 2023-02-09 ENCOUNTER — Telehealth: Payer: Self-pay

## 2023-02-09 NOTE — Telephone Encounter (Signed)
I was notified by pre-op that the patient has been unreachable and they left 4 voicemail's asking pt to return their call. I also reached out to patient to confirm her her 02/20/23 surgery. No answer, left voicemail asking her to call me asap to confirm the [phone]. I also reminded patient of the $100 no-show fee.

## 2023-02-13 ENCOUNTER — Telehealth: Payer: Self-pay

## 2023-02-13 NOTE — Telephone Encounter (Signed)
Patient left me a voicemail asking that we cancel her 02/20/23 procedure w/ Dr. Donavan Foil. I called centralized scheduling and canceled the procedure as requested.

## 2023-02-20 ENCOUNTER — Ambulatory Visit (HOSPITAL_BASED_OUTPATIENT_CLINIC_OR_DEPARTMENT_OTHER)
Admission: RE | Admit: 2023-02-20 | Payer: Medicaid Other | Source: Home / Self Care | Admitting: Obstetrics and Gynecology

## 2023-02-20 DIAGNOSIS — N921 Excessive and frequent menstruation with irregular cycle: Secondary | ICD-10-CM

## 2023-02-20 HISTORY — DX: Excessive and frequent menstruation with regular cycle: N92.0

## 2023-02-20 SURGERY — DILATATION & CURETTAGE/HYSTEROSCOPY WITH MYOSURE
Anesthesia: Choice

## 2023-03-05 DIAGNOSIS — Z419 Encounter for procedure for purposes other than remedying health state, unspecified: Secondary | ICD-10-CM | POA: Diagnosis not present

## 2023-04-05 DIAGNOSIS — Z419 Encounter for procedure for purposes other than remedying health state, unspecified: Secondary | ICD-10-CM | POA: Diagnosis not present

## 2023-05-06 DIAGNOSIS — Z419 Encounter for procedure for purposes other than remedying health state, unspecified: Secondary | ICD-10-CM | POA: Diagnosis not present

## 2023-06-05 DIAGNOSIS — Z419 Encounter for procedure for purposes other than remedying health state, unspecified: Secondary | ICD-10-CM | POA: Diagnosis not present

## 2023-07-06 DIAGNOSIS — Z419 Encounter for procedure for purposes other than remedying health state, unspecified: Secondary | ICD-10-CM | POA: Diagnosis not present

## 2023-08-05 DIAGNOSIS — Z419 Encounter for procedure for purposes other than remedying health state, unspecified: Secondary | ICD-10-CM | POA: Diagnosis not present

## 2023-09-05 DIAGNOSIS — Z419 Encounter for procedure for purposes other than remedying health state, unspecified: Secondary | ICD-10-CM | POA: Diagnosis not present

## 2023-10-06 DIAGNOSIS — Z419 Encounter for procedure for purposes other than remedying health state, unspecified: Secondary | ICD-10-CM | POA: Diagnosis not present

## 2023-11-03 DIAGNOSIS — Z419 Encounter for procedure for purposes other than remedying health state, unspecified: Secondary | ICD-10-CM | POA: Diagnosis not present

## 2023-12-15 DIAGNOSIS — Z419 Encounter for procedure for purposes other than remedying health state, unspecified: Secondary | ICD-10-CM | POA: Diagnosis not present

## 2024-01-14 DIAGNOSIS — Z419 Encounter for procedure for purposes other than remedying health state, unspecified: Secondary | ICD-10-CM | POA: Diagnosis not present

## 2024-02-14 DIAGNOSIS — Z419 Encounter for procedure for purposes other than remedying health state, unspecified: Secondary | ICD-10-CM | POA: Diagnosis not present

## 2024-03-15 DIAGNOSIS — Z419 Encounter for procedure for purposes other than remedying health state, unspecified: Secondary | ICD-10-CM | POA: Diagnosis not present

## 2024-04-15 DIAGNOSIS — Z419 Encounter for procedure for purposes other than remedying health state, unspecified: Secondary | ICD-10-CM | POA: Diagnosis not present

## 2024-05-16 ENCOUNTER — Other Ambulatory Visit: Payer: Self-pay | Admitting: Obstetrics and Gynecology

## 2024-05-16 DIAGNOSIS — Z419 Encounter for procedure for purposes other than remedying health state, unspecified: Secondary | ICD-10-CM | POA: Diagnosis not present
